# Patient Record
Sex: Female | Born: 1955 | Race: White | Hispanic: No | Smoking: Former smoker
Health system: Southern US, Community
[De-identification: ages and names within clinical notes are randomized; demographics above are authoritative.]

## PROBLEM LIST (undated history)

## (undated) DIAGNOSIS — B1920 Unspecified viral hepatitis C without hepatic coma: Secondary | ICD-10-CM

## (undated) DIAGNOSIS — J45909 Unspecified asthma, uncomplicated: Secondary | ICD-10-CM

## (undated) DIAGNOSIS — N289 Disorder of kidney and ureter, unspecified: Secondary | ICD-10-CM

## (undated) DIAGNOSIS — M069 Rheumatoid arthritis, unspecified: Secondary | ICD-10-CM

## (undated) DIAGNOSIS — A150 Tuberculosis of lung: Secondary | ICD-10-CM

## (undated) DIAGNOSIS — F411 Generalized anxiety disorder: Secondary | ICD-10-CM

## (undated) DIAGNOSIS — K259 Gastric ulcer, unspecified as acute or chronic, without hemorrhage or perforation: Secondary | ICD-10-CM

## (undated) DIAGNOSIS — G8929 Other chronic pain: Secondary | ICD-10-CM

## (undated) DIAGNOSIS — M62838 Other muscle spasm: Secondary | ICD-10-CM

## (undated) DIAGNOSIS — K7290 Hepatic failure, unspecified without coma: Secondary | ICD-10-CM

## (undated) DIAGNOSIS — G4700 Insomnia, unspecified: Secondary | ICD-10-CM

## (undated) DIAGNOSIS — E876 Hypokalemia: Secondary | ICD-10-CM

## (undated) HISTORY — DX: Rheumatoid arthritis, unspecified: M06.9

## (undated) HISTORY — DX: Generalized anxiety disorder: F41.1

## (undated) HISTORY — PX: CHOLECYSTECTOMY: SHX55

## (undated) HISTORY — DX: Other chronic pain: G89.29

## (undated) HISTORY — DX: Other muscle spasm: M62.838

## (undated) HISTORY — DX: Hypokalemia: E87.6

## (undated) HISTORY — DX: Insomnia, unspecified: G47.00

---

## 1982-05-17 DIAGNOSIS — A150 Tuberculosis of lung: Secondary | ICD-10-CM

## 1982-05-17 HISTORY — DX: Tuberculosis of lung: A15.0

## 2014-08-27 LAB — HM COLONOSCOPY

## 2015-03-03 ENCOUNTER — Encounter: Payer: Self-pay | Admitting: Radiology

## 2015-03-03 ENCOUNTER — Emergency Department: Payer: Medicare Other

## 2015-03-03 ENCOUNTER — Emergency Department
Admission: EM | Admit: 2015-03-03 | Discharge: 2015-03-03 | Disposition: A | Discharge status: Home / Self Care | Payer: Medicare Other | Attending: Emergency Medicine | Admitting: Emergency Medicine

## 2015-03-03 DIAGNOSIS — R1013 Epigastric pain: Secondary | ICD-10-CM | POA: Insufficient documentation

## 2015-03-03 DIAGNOSIS — R1011 Right upper quadrant pain: Secondary | ICD-10-CM

## 2015-03-03 DIAGNOSIS — Z91040 Latex allergy status: Secondary | ICD-10-CM | POA: Diagnosis not present

## 2015-03-03 HISTORY — DX: Unspecified asthma, uncomplicated: J45.909

## 2015-03-03 LAB — CBC WITH DIFFERENTIAL/PLATELET
BASOS ABS: 0 10*3/uL (ref 0–0.1)
BASOS PCT: 0 %
Eosinophils Absolute: 0.1 10*3/uL (ref 0–0.7)
Eosinophils Relative: 2 %
HEMATOCRIT: 45.3 % (ref 35.0–47.0)
HEMOGLOBIN: 15.8 g/dL (ref 12.0–16.0)
LYMPHS PCT: 29 %
Lymphs Abs: 2.2 10*3/uL (ref 1.0–3.6)
MCH: 33.5 pg (ref 26.0–34.0)
MCHC: 34.9 g/dL (ref 32.0–36.0)
MCV: 96.1 fL (ref 80.0–100.0)
MONOS PCT: 6 %
Monocytes Absolute: 0.5 10*3/uL (ref 0.2–0.9)
NEUTROS ABS: 4.7 10*3/uL (ref 1.4–6.5)
NEUTROS PCT: 63 %
Platelets: 160 10*3/uL (ref 150–440)
RBC: 4.72 MIL/uL (ref 3.80–5.20)
RDW: 12.7 % (ref 11.5–14.5)
WBC: 7.5 10*3/uL (ref 3.6–11.0)

## 2015-03-03 LAB — URINALYSIS COMPLETE WITH MICROSCOPIC (ARMC ONLY)
Bacteria, UA: NONE SEEN
Bilirubin Urine: NEGATIVE
Glucose, UA: NEGATIVE mg/dL
Hgb urine dipstick: NEGATIVE
KETONES UR: NEGATIVE mg/dL
NITRITE: NEGATIVE
Protein, ur: NEGATIVE mg/dL
SPECIFIC GRAVITY, URINE: 1.017 (ref 1.005–1.030)
pH: 5 (ref 5.0–8.0)

## 2015-03-03 LAB — COMPREHENSIVE METABOLIC PANEL
ALBUMIN: 4 g/dL (ref 3.5–5.0)
ALK PHOS: 58 U/L (ref 38–126)
ALT: 17 U/L (ref 14–54)
ANION GAP: 7 (ref 5–15)
AST: 22 U/L (ref 15–41)
BUN: 10 mg/dL (ref 6–20)
CALCIUM: 9.1 mg/dL (ref 8.9–10.3)
CO2: 25 mmol/L (ref 22–32)
Chloride: 106 mmol/L (ref 101–111)
Creatinine, Ser: 0.68 mg/dL (ref 0.44–1.00)
GFR calc non Af Amer: >60 mL/min (ref >60)
Glucose, Bld: 96 mg/dL (ref 65–99)
POTASSIUM: 3.6 mmol/L (ref 3.5–5.1)
SODIUM: 138 mmol/L (ref 135–145)
TOTAL PROTEIN: 7.7 g/dL (ref 6.5–8.1)
Total Bilirubin: 0.7 mg/dL (ref 0.3–1.2)

## 2015-03-03 LAB — LIPASE, BLOOD: Lipase: 27 U/L (ref 22–51)

## 2015-03-03 MED ORDER — KETOROLAC TROMETHAMINE 30 MG/ML IJ SOLN
30.0000 mg | Freq: Once | INTRAMUSCULAR | Status: AC
  Administered 2015-03-03: 30 mg via INTRAVENOUS
  Filled 2015-03-03: qty 1

## 2015-03-03 MED ORDER — RANITIDINE HCL 150 MG PO CAPS: 150.0000 mg | ORAL_CAPSULE | Freq: Two times a day (BID) | ORAL | Status: DC

## 2015-03-03 MED ORDER — OXYCODONE-ACETAMINOPHEN 5-325 MG PO TABS: 1.0000 | ORAL_TABLET | Freq: Four times a day (QID) | ORAL | Status: DC | PRN

## 2015-03-03 MED ORDER — SUCRALFATE 1 G PO TABS: 1.0000 g | ORAL_TABLET | Freq: Four times a day (QID) | ORAL | Status: AC

## 2015-03-03 MED ORDER — IOHEXOL 300 MG/ML  SOLN
75.0000 mL | Freq: Once | INTRAMUSCULAR | Status: AC | PRN
  Administered 2015-03-03: 75 mL via INTRAVENOUS
  Filled 2015-03-03: qty 75

## 2015-03-03 MED ORDER — IOHEXOL 240 MG/ML SOLN
25.0000 mL | INTRAMUSCULAR | Status: AC
  Administered 2015-03-03: 25 mL via ORAL
  Filled 2015-03-03: qty 25

## 2015-03-03 NOTE — ED Notes (Signed)
Patient transported to CT 

## 2015-03-03 NOTE — Discharge Instructions (Signed)
You were prescribed a medication that is potentially sedating. Do not drink alcohol, drive or participate in any other potentially dangerous activities while taking this medication as it may make you sleepy. Do not take this medication with any other sedating medications, either prescription or over-the-counter. If you were prescribed Percocet or Vicodin, do not take these with acetaminophen (Tylenol) as it is already contained within these medications. °  °Opioid pain medications (or "narcotics") can be habit forming.  Use it as little as possible to achieve adequate pain control.  Do not use or use it with extreme caution if you have a history of opiate abuse or dependence.  If you are on a pain contract with your primary care doctor or a pain specialist, be sure to let them know you were prescribed this medication today from the [facility].  This medication is intended for your use only - do not give any to anyone else and keep it in a secure place where nobody else, especially children and pets, have access to it.  It will also cause or worsen constipation, so you may want to consider taking an over-the-counter stool softener while you are taking this medication. ° °Abdominal Pain, Adult °Many things can cause abdominal pain. Usually, abdominal pain is not caused by a disease and will improve without treatment. It can often be observed and treated at home. Your health care provider will do a physical exam and possibly order blood tests and X-rays to help determine the seriousness of your pain. However, in many cases, more time must pass before a clear cause of the pain can be found. Before that point, your health care provider may not know if you need more testing or further treatment. °HOME CARE INSTRUCTIONS °Monitor your abdominal pain for any changes. The following actions may help to alleviate any discomfort you are experiencing: °· Only take over-the-counter or prescription  medicines as directed by your health care provider. °· Do not take laxatives unless directed to do so by your health care provider. °· Try a clear liquid diet (broth, tea, or water) as directed by your health care provider. Slowly move to a bland diet as tolerated. °SEEK MEDICAL CARE IF: °· You have unexplained abdominal pain. °· You have abdominal pain associated with nausea or diarrhea. °· You have pain when you urinate or have a bowel movement. °· You experience abdominal pain that wakes you in the night. °· You have abdominal pain that is worsened or improved by eating food. °· You have abdominal pain that is worsened with eating fatty foods. °· You have a fever. °SEEK IMMEDIATE MEDICAL CARE IF: °· Your pain does not go away within 2 hours. °· You keep throwing up (vomiting). °· Your pain is felt only in portions of the abdomen, such as the right side or the left lower portion of the abdomen. °· You pass bloody or black tarry stools. °MAKE SURE YOU: °· Understand these instructions. °· Will watch your condition. °· Will get help right away if you are not doing well or get worse. °  °This information is not intended to replace advice given to you by your health care provider. Make sure you discuss any questions you have with your health care provider. °  °Document Released: 02/10/2005 Document Revised: 01/22/2015 Document Reviewed: 01/10/2013 °Elsevier Interactive Patient Education ©2016 [employer] ° °

## 2015-03-03 NOTE — ED Provider Notes (Signed)
Sign out to follow up with a CAT scan of the abdomen and pelvis. Reassuring labs as well as vital signs. Physical Exam  BP 124/72 mmHg  Pulse 69  Temp(Src) 98 F (36.7 C) (Oral)  Resp 18  Ht 5\' 8"  (1.727 m)  Wt 109 lb (49.442 kg)  BMI 16.58 kg/m2  SpO2 100%  Physical Exam patient is resting comfortably on the gurney at the time of the exam. No outward signs of distress or pain.  ED Course  Procedures  MDM I discussed with the patient her lab results as well as her CAT scan results. She is aware of the renal cyst. She was given follow-up information for multiple local clinics. I also gave her copy of the CAT scan reports that she may take it with her to her follow-up appointment. She knows that she must follow-up for further evaluation and likely repeat imaging. We discussed there was no obvious cause of her lab work as well as imaging tonight to examine her pain. She understands the plan and is willing to comply. She'll be discharged home.     Myrna Blazeravid Matthew Sherylann Vangorden, MD 03/03/15 [id number]

## 2015-03-03 NOTE — ED Notes (Signed)
Pt in with co upper abd pain since 3 weeks, states has been told she had cirrhosis and fibrosis but patient unsure.
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
## 2015-03-13 ENCOUNTER — Encounter: Payer: Self-pay | Admitting: Emergency Medicine

## 2015-03-13 ENCOUNTER — Emergency Department
Admission: EM | Admit: 2015-03-13 | Discharge: 2015-03-13 | Discharge status: Against Medical Advice | Payer: Medicare Other | Attending: Emergency Medicine | Admitting: Emergency Medicine

## 2015-03-13 DIAGNOSIS — R079 Chest pain, unspecified: Secondary | ICD-10-CM | POA: Diagnosis not present

## 2015-03-13 DIAGNOSIS — Z87891 Personal history of nicotine dependence: Secondary | ICD-10-CM | POA: Insufficient documentation

## 2015-03-13 DIAGNOSIS — R1011 Right upper quadrant pain: Secondary | ICD-10-CM | POA: Insufficient documentation

## 2015-03-13 HISTORY — DX: Unspecified viral hepatitis C without hepatic coma: B19.20

## 2015-03-13 LAB — COMPREHENSIVE METABOLIC PANEL
ALK PHOS: 53 U/L (ref 38–126)
ALT: 9 U/L — AB (ref 14–54)
ANION GAP: 6 (ref 5–15)
AST: 18 U/L (ref 15–41)
Albumin: 4.1 g/dL (ref 3.5–5.0)
BILIRUBIN TOTAL: 0.8 mg/dL (ref 0.3–1.2)
BUN: 8 mg/dL (ref 6–20)
CALCIUM: 8.7 mg/dL — AB (ref 8.9–10.3)
CO2: 27 mmol/L (ref 22–32)
CREATININE: 0.63 mg/dL (ref 0.44–1.00)
Chloride: 104 mmol/L (ref 101–111)
Glucose, Bld: 89 mg/dL (ref 65–99)
Potassium: 3 mmol/L — ABNORMAL LOW (ref 3.5–5.1)
Sodium: 137 mmol/L (ref 135–145)
TOTAL PROTEIN: 7.9 g/dL (ref 6.5–8.1)

## 2015-03-13 LAB — CBC
HCT: 42.6 % (ref 35.0–47.0)
HEMOGLOBIN: 14.5 g/dL (ref 12.0–16.0)
MCH: 32.5 pg (ref 26.0–34.0)
MCHC: 34.1 g/dL (ref 32.0–36.0)
MCV: 95.5 fL (ref 80.0–100.0)
PLATELETS: 155 10*3/uL (ref 150–440)
RBC: 4.46 MIL/uL (ref 3.80–5.20)
RDW: 12.7 % (ref 11.5–14.5)
WBC: 5.9 10*3/uL (ref 3.6–11.0)

## 2015-03-13 LAB — LIPASE, BLOOD: Lipase: 27 U/L (ref 11–51)

## 2015-03-13 NOTE — ED Notes (Addendum)
Pt to ed with c/o right upper abd pain and chest pain, states history of HEP C.  Pt reports increased pain over the last few days.

## 2015-04-04 ENCOUNTER — Emergency Department: Payer: Medicare Other

## 2015-04-04 ENCOUNTER — Emergency Department
Admission: EM | Admit: 2015-04-04 | Discharge: 2015-04-04 | Disposition: A | Discharge status: Home / Self Care | Payer: Medicare Other | Attending: Emergency Medicine | Admitting: Emergency Medicine

## 2015-04-04 DIAGNOSIS — W1839XA Other fall on same level, initial encounter: Secondary | ICD-10-CM | POA: Diagnosis not present

## 2015-04-04 DIAGNOSIS — Y92012 Bathroom of single-family (private) house as the place of occurrence of the external cause: Secondary | ICD-10-CM | POA: Insufficient documentation

## 2015-04-04 DIAGNOSIS — Z79899 Other long term (current) drug therapy: Secondary | ICD-10-CM | POA: Diagnosis not present

## 2015-04-04 DIAGNOSIS — Y9389 Activity, other specified: Secondary | ICD-10-CM | POA: Insufficient documentation

## 2015-04-04 DIAGNOSIS — Z87891 Personal history of nicotine dependence: Secondary | ICD-10-CM | POA: Insufficient documentation

## 2015-04-04 DIAGNOSIS — Y998 Other external cause status: Secondary | ICD-10-CM | POA: Insufficient documentation

## 2015-04-04 DIAGNOSIS — Z91040 Latex allergy status: Secondary | ICD-10-CM | POA: Diagnosis not present

## 2015-04-04 DIAGNOSIS — S92351A Displaced fracture of fifth metatarsal bone, right foot, initial encounter for closed fracture: Secondary | ICD-10-CM

## 2015-04-04 DIAGNOSIS — R42 Dizziness and giddiness: Secondary | ICD-10-CM | POA: Diagnosis not present

## 2015-04-04 DIAGNOSIS — S99921A Unspecified injury of right foot, initial encounter: Secondary | ICD-10-CM | POA: Diagnosis present

## 2015-04-04 MED ORDER — OXYCODONE-ACETAMINOPHEN 5-325 MG PO TABS: 1.0000 | ORAL_TABLET | ORAL | Status: DC | PRN

## 2015-04-04 MED ORDER — OXYCODONE-ACETAMINOPHEN 5-325 MG PO TABS
2.0000 | ORAL_TABLET | Freq: Once | ORAL | Status: AC
  Administered 2015-04-04: 2 via ORAL
  Filled 2015-04-04: qty 2

## 2015-04-04 MED ORDER — DOCUSATE SODIUM 100 MG PO CAPS: ORAL_CAPSULE | ORAL | Status: DC
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
## 2015-04-04 NOTE — Discharge Instructions (Signed)
Please do not bear any weight with your affected leg.  Read through the instructions regarding routine injury care (rest, ice, compression, elevation).  Call in the morning to schedule a follow-up appointment on Monday with Dr. Hyacinth Meeker to discuss additional care and treatment of your fracture.  Return to the emergency department with new or worsening symptoms that concern you.   Metatarsal Fracture A metatarsal fracture is a broken bone in one of the five bones that connect your toes to the rest of your foot (forefoot fracture). Metatarsals are long bones that can be stressed or cracked easily. A metatarsal fracture can be:  A stress fracture. Stress fractures are cracks in the surface of the metatarsal bone. Athletes often get stress fractures.  A complete fracture. A complete fracture goes all the way through the bone. The bone that connects to the pinky toe (fifth metatarsal) is the most commonly fractured metatarsal. Ballet dancers often fracture this bone. CAUSES  Stress fractures may be caused by:  Poor training technique.  Sudden increase in activity.  Changing your activity to a harder surface.  Wearing athletic shoes that do not have enough cushioning. Complete fractures are usually caused by:  Dropping a heavy object on your foot.  An injury that severely twists your foot. SIGNS AND SYMPTOMS The most common symptom of a stress fracture is foot pain that goes away with rest. The most common symptom of a complete fracture is intense pain that persists after the injury. Other symptoms of both fracture types include:  Bruising.  Swelling.  Pain with movement or putting weight on the foot.  Tenderness or pain with pressure.  Trouble walking. DIAGNOSIS  Your health care provider may suspect a metatarsal fracture based on your symptoms and medical history. Your health care provider will also do a physical exam. During the physical exam, your health care provider may try to  move your foot and toes to check for pain and limited movement. Your foot will also be checked for:  Bruising.  Tenderness.  Swelling.  Deformity. Other tests that may be done include:  X-rays. X-rays are able to show most fractures.  A bone scan. This test may be necessary to show a stress fracture. TREATMENT  Treatment for a metatarsal fracture depends on how severe the fracture was and the type of fracture. Treatment may include:  Surgery. Surgery is usually needed to repair a displaced fracture. A displaced fracture happens when pieces of the broken bone are moved out of place (displacement). After surgery, you may need to wear a short walking cast for 6 to 8 weeks.  Medicines. Medicines may be used to reduce swelling and pain.  Physical therapy. Physical therapy may last for several months.  Use of a supportive device, such as:  Elastic wrap.  Splint.  Boot.  Cast.  Crutches to support weight on your foot until the broken bone heals. You can usually treat a stress fracture or a nondisplaced fracture with:   Rest.  Ice.  Elevation.  Support. HOME CARE INSTRUCTIONS  Follow all your health care provider's instructions.  Take medicine only as directed by your health care provider.  Rest your foot until your health care provider says you can resume your usual activities.  When resting, keep your foot raised above the level of your heart (elevated).  Ice may help reduce pain and swelling.  Place ice in a plastic bag.  Place a towel between your skin and the bag.  Leave the ice on  for 20 minutes, 2-3 times a day.  Wear your supportive device as directed.  Do not get your cast or splint wet.  Keep all follow-up visits as directed by your health care provider. If your health care provider recommended physical therapy, it is very important for proper healing of your injury that you keep all visits.   Cast or Splint Care Casts and splints support injured  limbs and keep bones from moving while they heal. It is important to care for your cast or splint at home.  HOME CARE INSTRUCTIONS  Keep the cast or splint uncovered during the drying period. It can take 24 to 48 hours to dry if it is made of plaster. A fiberglass cast will dry in less than 1 hour.  Do not rest the cast on anything harder than a pillow for the first 24 hours.  Do not put weight on your injured limb or apply pressure to the cast until your health care provider gives you permission.  Keep the cast or splint dry. Wet casts or splints can lose their shape and may not support the limb as well. A wet cast that has lost its shape can also create harmful pressure on your skin when it dries. Also, wet skin can become infected.  Cover the cast or splint with a plastic bag when bathing or when out in the rain or snow. If the cast is on the trunk of the body, take sponge baths until the cast is removed.  If your cast does become wet, dry it with a towel or a blow dryer on the cool setting only.  Keep your cast or splint clean. Soiled casts may be wiped with a moistened cloth.  Do not place any hard or soft foreign objects under your cast or splint, such as cotton, toilet paper, lotion, or powder.  Do not try to scratch the skin under the cast with any object. The object could get stuck inside the cast. Also, scratching could lead to an infection. If itching is a problem, use a blow dryer on a cool setting to relieve discomfort.  Do not trim or cut your cast or remove padding from inside of it.  Exercise all joints next to the injury that are not immobilized by the cast or splint. For example, if you have a long leg cast, exercise the hip joint and toes. If you have an arm cast or splint, exercise the shoulder, elbow, thumb, and fingers.  Elevate your injured arm or leg on 1 or 2 pillows for the first 1 to 3 days to decrease swelling and pain.It is best if you can comfortably elevate  your cast so it is higher than your heart. SEEK MEDICAL CARE IF:   Your cast or splint cracks.  Your cast or splint is too tight or too loose.  You have unbearable itching inside the cast.  Your cast becomes wet or develops a soft spot or area.  You have a bad smell coming from inside your cast.  You get an object stuck under your cast.  Your skin around the cast becomes red or raw.  You have new pain or worsening pain after the cast has been applied. SEEK IMMEDIATE MEDICAL CARE IF:   You have fluid leaking through the cast.  You are unable to move your fingers or toes.  You have discolored (blue or white), cool, painful, or very swollen fingers or toes beyond the cast.  You have tingling or numbness around  the injured area.  You have severe pain or pressure under the cast.  You have any difficulty with your breathing or have shortness of breath.  You have chest pain.   This information is not intended to replace advice given to you by your health care provider. Make sure you discuss any questions you have with your health care provider.   Document Released: 04/30/2000 Document Revised: 02/21/2013 Document Reviewed: 11/09/2012 Elsevier Interactive Patient Education [employer]

## 2015-04-04 NOTE — ED Notes (Signed)
Pt in radiology at this time. 

## 2015-04-04 NOTE — ED Provider Notes (Signed)
[facility] Emergency Department Provider Note  ____________________________________________  Time seen: Approximately 3:11 AM  I have reviewed the triage vital signs and the nursing notes.   HISTORY  Chief Complaint Foot Injury    HPI Victoria Bonilla is a 59 y.o. female who presents with right foot and ankle pain after a mechanical fall.  She reports that she felt a little bit dizzy when she stood up to go to the bathroom which is a result of some medication that she is taking and not unusual for her.  She rolled her ankle by accident and had immediate acute onset of severe pain in thelateral right ankle and in her aspect of her foot.  She feels like she heard or felt a snapping sensation when it occurred.  She did not sustain any other injuries including no head injury or loss of consciousness.  Her pain is worsened by any attempt at weightbearing and movement of the foot and ankle and relieved minimally with rest.   Past Medical History  Diagnosis Date  . Asthma   . Hepatitis C     There are no active problems to display for this patient.   No past surgical history on file.  Current Outpatient Rx  Name  Route  Sig  Dispense  Refill  . docusate sodium (COLACE) 100 MG capsule      Take 1 tablet once or twice daily as needed for constipation while taking narcotic pain medicine   30 capsule   0   . oxyCODONE-acetaminophen (ROXICET) 5-325 MG tablet   Oral   Take 1-2 tablets by mouth every 4 (four) hours as needed for severe pain.   20 tablet   0   . ranitidine (ZANTAC) 150 MG capsule   Oral   Take 1 capsule (150 mg total) by mouth 2 (two) times daily.   28 capsule   0   . sucralfate (CARAFATE) 1 G tablet   Oral   Take 1 tablet (1 g total) by mouth 4 (four) times daily.   120 tablet   1     Allergies Nsaids; Iodine; and Latex  No family history on file.  Social History Social History  Substance Use Topics  . Smoking status: Former  Games developermoker  . Smokeless tobacco: Not on file  . Alcohol Use: No    Review of Systems Constitutional: No fever/chills Cardiovascular: Denies chest pain. Respiratory: Denies shortness of breath. Gastrointestinal: No abdominal pain.  No nausea, no vomiting.  No diarrhea.  No constipation. Musculoskeletal: Severe pain in the right lateral ankle and right lateral foot with no other injuries reported Skin: Negative for rash. Neurological: Negative for headaches, focal weakness or numbness.  No syncope.  ____________________________________________   PHYSICAL EXAM:  VITAL SIGNS: ED Triage Vitals  Enc Vitals Group     BP 04/04/15 0205 123/55 mmHg     Pulse Rate 04/04/15 0205 69     Resp 04/04/15 0205 18     Temp 04/04/15 0205 97.4 F (36.3 C)     Temp Source 04/04/15 0205 Oral     SpO2 04/04/15 0205 99 %     Weight 04/04/15 0203 108 lb (48.988 kg)     Height 04/04/15 0203 5\' 8"  (1.727 m)     Head Cir --      Peak Flow --      Pain Score 04/04/15 0202 10     Pain Loc --      Pain Edu? --  Excl. in GC? --     Constitutional: Alert and oriented. Well appearing and in no acute distress. Head: Atraumatic. Neck: No stridor.  No cervical spine tenderness to palpation. Cardiovascular: Normal rate, regular rhythm. Grossly normal heart sounds.  Good peripheral circulation. Respiratory: Normal respiratory effort.  No retractions. Lungs CTAB. Musculoskeletal: No obvious deformity to the right foot but with mild swelling around the right ankle.  Severe tenderness to palpation both of the lateral ankle and the lateral foot in the region of the fifth metatarsal.  This is consistent with her radiograph findings.  Neurovascularly intact distal to the injury. Neurologic:  Normal speech and language. No gross focal neurologic deficits are appreciated.  Skin:  Skin is warm, dry and intact. No rash noted. Psychiatric: Mood and affect are normal. Speech and behavior are  normal.  ____________________________________________   LABS (all labs ordered are listed, but only abnormal results are displayed)  Labs Reviewed - No data to display ____________________________________________  EKG  Not indicated ____________________________________________  RADIOLOGY   Dg Ankle Complete Right  04/04/2015  CLINICAL DATA:  Rolled right ankle, with right ankle pain and swelling. Initial encounter. EXAM: RIGHT ANKLE - COMPLETE 3+ VIEW COMPARISON:  None. FINDINGS: There is no evidence of fracture or dislocation at the ankle. The ankle mortise is intact; the interosseous space is within normal limits. No talar tilt or subluxation is seen. A small plantar calcaneal spur is noted. The fracture through the fifth metatarsal is better characterized on concurrent foot radiographs. The joint spaces are preserved. An ankle joint effusion noted. Mild lateral soft tissue swelling is noted. IMPRESSION: No evidence of fracture or dislocation at the ankle. Ankle joint effusion noted. Fracture through the fifth metatarsal is better characterized on concurrent foot radiographs. Electronically Signed   By: Roanna Raider M.D.   On: 04/04/2015 02:52   Dg Foot Complete Right  04/04/2015  CLINICAL DATA:  Pain at the right fifth metatarsal and ankle after rolling ankle. Initial encounter. EXAM: RIGHT FOOT COMPLETE - 3+ VIEW COMPARISON:  None. FINDINGS: There is a mildly displaced oblique fracture through the fifth metatarsal, with mild medial and dorsal displacement. Overlying soft tissue swelling is noted. No additional fractures are seen. Visualized joint spaces are preserved. An os peroneum is noted. An os naviculare is seen. The ankle joint is grossly unremarkable in appearance. IMPRESSION: Mildly displaced oblique fracture through the fifth metatarsal, with mild medial and dorsal displacement. Electronically Signed   By: Roanna Raider M.D.   On: 04/04/2015 02:50     ____________________________________________   PROCEDURES  Procedure(s) performed: splint, see procedure note(s).   SPLINT APPLICATION Date/Time: 3:33 AM Authorized by: Loleta Rose Consent: Verbal consent obtained. Risks and benefits: risks, benefits and alternatives were discussed Consent given by: patient Splint applied by: ED technician Location details: right foot Splint type: posterior short leg Supplies used: ortho-glass Post-procedure: The splinted body part was neurovascularly unchanged following the procedure. Patient tolerance: Patient tolerated the procedure well with no immediate complications.  Critical Care performed: No ____________________________________________   INITIAL IMPRESSION / ASSESSMENT AND PLAN / ED COURSE  Pertinent labs & imaging results that were available during my care of the patient were reviewed by me and considered in my medical decision making (see chart for details).  I discussed the case by phone with Dr. Deeann Saint who agreed with my plan for posterior short leg splint, nonweightbearing, crutches, and he will see her early next week for a follow-up appointment.  I gave my  usual and customary return precautions and management recommendations.   ____________________________________________  FINAL CLINICAL IMPRESSION(S) / ED DIAGNOSES  Final diagnoses:  Closed fracture of fifth metatarsal bone, right, initial encounter      NEW MEDICATIONS STARTED DURING THIS VISIT:  New Prescriptions   DOCUSATE SODIUM (COLACE) 100 MG CAPSULE    Take 1 tablet once or twice daily as needed for constipation while taking narcotic pain medicine   OXYCODONE-ACETAMINOPHEN (ROXICET) 5-325 MG TABLET    Take 1-2 tablets by mouth every 4 (four) hours as needed for severe pain.     Loleta Rose, MD 04/04/15 [phone]

## 2015-04-04 NOTE — ED Notes (Signed)
Pt verbalizes understanding of discharge instructions.

## 2015-06-30 ENCOUNTER — Ambulatory Visit (INDEPENDENT_AMBULATORY_CARE_PROVIDER_SITE_OTHER): Payer: Medicare Other | Admitting: Primary Care

## 2015-06-30 ENCOUNTER — Encounter: Payer: Self-pay | Admitting: Primary Care

## 2015-06-30 VITALS — BP 132/72 | Ht 66.25 in | Wt 115.0 lb

## 2015-06-30 DIAGNOSIS — G8929 Other chronic pain: Secondary | ICD-10-CM

## 2015-06-30 DIAGNOSIS — M6249 Contracture of muscle, multiple sites: Secondary | ICD-10-CM

## 2015-06-30 DIAGNOSIS — E876 Hypokalemia: Secondary | ICD-10-CM

## 2015-06-30 DIAGNOSIS — J4520 Mild intermittent asthma, uncomplicated: Secondary | ICD-10-CM | POA: Diagnosis not present

## 2015-06-30 DIAGNOSIS — F411 Generalized anxiety disorder: Secondary | ICD-10-CM

## 2015-06-30 DIAGNOSIS — G4700 Insomnia, unspecified: Secondary | ICD-10-CM

## 2015-06-30 DIAGNOSIS — M62838 Other muscle spasm: Secondary | ICD-10-CM | POA: Insufficient documentation

## 2015-06-30 DIAGNOSIS — B1920 Unspecified viral hepatitis C without hepatic coma: Secondary | ICD-10-CM | POA: Insufficient documentation

## 2015-06-30 MED ORDER — CARISOPRODOL 350 MG PO TABS: 350.0000 mg | ORAL_TABLET | Freq: Four times a day (QID) | ORAL | Status: AC

## 2015-06-30 MED ORDER — ALBUTEROL SULFATE HFA 108 (90 BASE) MCG/ACT IN AERS: 2.0000 | INHALATION_SPRAY | Freq: Four times a day (QID) | RESPIRATORY_TRACT | Status: AC | PRN

## 2015-06-30 MED ORDER — POTASSIUM CHLORIDE CRYS ER 20 MEQ PO TBCR: 20.0000 meq | EXTENDED_RELEASE_TABLET | Freq: Every day | ORAL | Status: AC

## 2015-06-30 MED ORDER — BUSPIRONE HCL 7.5 MG PO TABS: 7.5000 mg | ORAL_TABLET | Freq: Two times a day (BID) | ORAL | Status: AC

## 2015-06-30 MED ORDER — TRAZODONE HCL 100 MG PO TABS: 100.0000 mg | ORAL_TABLET | Freq: Every day | ORAL | Status: AC

## 2015-06-30 NOTE — Assessment & Plan Note (Signed)
History of with recent cure. Following with Hep C clinic.

## 2015-06-30 NOTE — Assessment & Plan Note (Signed)
Recently trialed on trazodone 50 mg. She's been taking 2 tablets with improvement. RX for 100 mg tablets sent to pharmacy. Will continue to monitor.

## 2015-06-30 NOTE — Patient Instructions (Addendum)
Start Buspar 7.5 mg tablets for daily anxiety. Take 1 tablet by mouth twice daily.  I've sent in refills of your potassium, albuterol inhaler, carisoprodol, and trazodone. I sent in Trazodone 100 mg tablets. Take 1 tablet by mouth at bedtime for sleep.  Follow up in 6 weeks for re-evaluation of anxiety.  It was a pleasure to meet you today! Please don't hesitate to call me with any questions. Welcome to [facility]!

## 2015-06-30 NOTE — Progress Notes (Signed)
Pre visit review using our clinic review tool, if applicable. No additional management support is needed unless otherwise documented below in the visit note. 

## 2015-06-30 NOTE — Progress Notes (Signed)
Subjective:    Patient ID: Victoria Bonilla, female    DOB: 02-12-1956, 60 y.o.   MRN: [id number]  HPI  Victoria Bonilla is a 60 year old female who presents today to establish care and discuss the problems mentioned below. Will obtain old records. Her last physical was November 2016. She is requesting a refill of her medications and has been out for several days.  1) Insomnia: Currently managed on Trazodone 50 mg, but is taking 100 mg. She was recently placed on this medication. She is not currently taking Temazepam.    2) Asthma: Diagnosed years ago. She has an albuterol inhaler that she will use once weekly as needed, worse during spring and summer months. No recent exacerbations.  3) Rheumatoid Arthritis: Diagnosed years ago ago. She endorses treatment in prior years and has trialed most RA medications. She didn't respond well to RA medications and does not currently follow with a rheumatologist. She is not interested in following with one now.   4) Metatarsal Fracture: Located to right foot. Injured 4 months. She is currently following with Dr. Hyacinth Meeker at [facility]. Her next follow up is February 28th.   5) Muscle Spasms: Chronic. She is currently managed on carisoprodol 350 mg four times daily and has been on this medication years. History of Restless Leg Syndrome as well.  6) Generalized Anxiety Disorder: Diagnosed years ago. She is currently managed on Valium 10 mg once to twice daily. GAD 7 score of 13 today. Denies SI/HI. She is reticent to trial daily SSRI, but is open.   7) Gastrointestinal Ulcers: She is currently managed on surcralfate 1 gram. She will take once daily, sometimes will skip. Overall improvement in symptoms with carafate.   8) Hypokalemia: Diagnosed years ago. She is currently managed on Potassium 20 meQ once daily. Denies chest pain. Last potassium level was in November per patient and was normal.  Review of Systems  Constitutional: Negative for unexpected  weight change.  Respiratory: Negative for cough, shortness of breath and wheezing.   Cardiovascular: Negative for chest pain.  Gastrointestinal: Negative for abdominal pain.  Genitourinary: Negative for difficulty urinating.  Musculoskeletal:       Chronic shoulder, back, foot pain.  Skin: Negative for rash.  Allergic/Immunologic: Positive for environmental allergies.  Neurological: Negative for dizziness, numbness and headaches.  Psychiatric/Behavioral: Positive for sleep disturbance. The patient is nervous/anxious.        See HPI. Denies SI/HI, concerns for depression       Past Medical History  Diagnosis Date  . Asthma   . Hepatitis C   . Chronic pain   . Hypokalemia   . Muscle spasm   . Generalized anxiety disorder   . Rheumatoid arthritis (HCC)   . Insomnia     Social History   Social History  . Marital Status: Widowed    Spouse Name: N/A  . Number of Children: N/A  . Years of Education: N/A   Occupational History  . Not on file.   Social History Main Topics  . Smoking status: Former Games developer  . Smokeless tobacco: Not on file  . Alcohol Use: No  . Drug Use: No  . Sexual Activity: Not on file   Other Topics Concern  . Not on file   Social History Narrative   Single.   Disabled.    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   Enjoys reading, writing.     No past surgical history on file.  No family history on file.  Allergies  Allergen Reactions  . Nsaids Shortness Of Breath  . Tolmetin Shortness Of Breath  . Tramadol Swelling  . Iodine Other (See Comments)    Burns skin   . Latex Swelling  . Other     Peg-intron (per pt)  . Iodides Rash    Pt states to iodine on skin caused rash; states that she has had IV/po contrast abd CTs multiple times in past without problem Pt states to iodine on skin caused rash; states that she has had IV/po contrast abd CTs multiple times in past without problem    Current Outpatient  Prescriptions on File Prior to Visit  Medication Sig Dispense Refill  . sucralfate (CARAFATE) 1 G tablet Take 1 tablet (1 g total) by mouth 4 (four) times daily. 120 tablet 1   No current facility-administered medications on file prior to visit.    BP 132/72 mmHg  Ht 5' 6.25" (1.683 m)  Wt 115 lb (52.164 kg)  BMI 18.42 kg/m2    Objective:   Physical Exam  Constitutional: She is oriented to person, place, and time. She appears well-nourished.  Neck: Neck supple.  Cardiovascular: Normal rate and regular rhythm.   Pulmonary/Chest: Effort normal and breath sounds normal.  Musculoskeletal:  Boot to right foot. Decrease in ROM to right shoulder.  Neurological: She is alert and oriented to person, place, and time.  Skin: Skin is warm and dry.  Psychiatric: She has a normal mood and affect.          Assessment & Plan:

## 2015-06-30 NOTE — Assessment & Plan Note (Signed)
Diagnosed in youth. Currently managed on valium 2-3 times daily by prior PCP. Discussed the importance of daily management with long term medication. She is reticent about SSRI's as she's read "bad things". Will start buspar BID and have her follow up in 6 weeks for re-evaluation. GAD 7 score of 13 today.

## 2015-06-30 NOTE — Assessment & Plan Note (Signed)
Also present during daytime hours, worse at night with RLS. Managed on Soma QID for years. Refill provided.

## 2015-06-30 NOTE — Assessment & Plan Note (Addendum)
Long standing history of. Managed on potassium 20 meq. Refill provided today. Last potassium level normal per patient. Will recheck at next visit as her level will likely be lower today due to running out of meds 3 days ago.

## 2015-06-30 NOTE — Assessment & Plan Note (Signed)
Present to shoulder, back, foot. Currently taking tylenol #4. Discussed that she will need to manage with pain management for further refills.  She has an unfilled script with her today that she plans on filling. Referral to pain management placed today.

## 2015-06-30 NOTE — Assessment & Plan Note (Signed)
More prevalent during spring and summer months. Has albuterol inhaler, uses sparingly.

## 2015-07-11 ENCOUNTER — Telehealth: Payer: Self-pay | Admitting: Primary Care

## 2015-07-11 ENCOUNTER — Encounter: Payer: Self-pay | Admitting: Primary Care

## 2015-07-19 ENCOUNTER — Emergency Department: Payer: Medicare Other

## 2015-07-19 ENCOUNTER — Encounter: Payer: Self-pay | Admitting: Emergency Medicine

## 2015-07-19 ENCOUNTER — Emergency Department
Admission: EM | Admit: 2015-07-19 | Discharge: 2015-07-19 | Disposition: A | Discharge status: Home / Self Care | Payer: Medicare Other | Attending: Emergency Medicine | Admitting: Emergency Medicine

## 2015-07-19 DIAGNOSIS — Z79899 Other long term (current) drug therapy: Secondary | ICD-10-CM | POA: Insufficient documentation

## 2015-07-19 DIAGNOSIS — Y658 Other specified misadventures during surgical and medical care: Secondary | ICD-10-CM | POA: Diagnosis not present

## 2015-07-19 DIAGNOSIS — K122 Cellulitis and abscess of mouth: Secondary | ICD-10-CM | POA: Diagnosis not present

## 2015-07-19 DIAGNOSIS — T85848A Pain due to other internal prosthetic devices, implants and grafts, initial encounter: Secondary | ICD-10-CM

## 2015-07-19 DIAGNOSIS — Z87891 Personal history of nicotine dependence: Secondary | ICD-10-CM | POA: Diagnosis not present

## 2015-07-19 DIAGNOSIS — Z91040 Latex allergy status: Secondary | ICD-10-CM | POA: Insufficient documentation

## 2015-07-19 DIAGNOSIS — T82848A Pain from vascular prosthetic devices, implants and grafts, initial encounter: Secondary | ICD-10-CM | POA: Diagnosis not present

## 2015-07-19 DIAGNOSIS — K0889 Other specified disorders of teeth and supporting structures: Secondary | ICD-10-CM | POA: Diagnosis present

## 2015-07-19 DIAGNOSIS — R079 Chest pain, unspecified: Secondary | ICD-10-CM

## 2015-07-19 LAB — CBC WITH DIFFERENTIAL/PLATELET
BASOS ABS: 0.1 10*3/uL (ref 0–0.1)
BASOS PCT: 1 %
EOS ABS: 0.1 10*3/uL (ref 0–0.7)
Eosinophils Relative: 3 %
HEMATOCRIT: 39.9 % (ref 35.0–47.0)
Hemoglobin: 14 g/dL (ref 12.0–16.0)
Lymphocytes Relative: 28 %
Lymphs Abs: 1.4 10*3/uL (ref 1.0–3.6)
MCH: 33.1 pg (ref 26.0–34.0)
MCHC: 35 g/dL (ref 32.0–36.0)
MCV: 94.5 fL (ref 80.0–100.0)
MONO ABS: 0.3 10*3/uL (ref 0.2–0.9)
MONOS PCT: 6 %
NEUTROS ABS: 3.2 10*3/uL (ref 1.4–6.5)
Neutrophils Relative %: 62 %
PLATELETS: 133 10*3/uL — AB (ref 150–440)
RBC: 4.23 MIL/uL (ref 3.80–5.20)
RDW: 13 % (ref 11.5–14.5)
WBC: 5.1 10*3/uL (ref 3.6–11.0)

## 2015-07-19 LAB — BASIC METABOLIC PANEL
ANION GAP: 5 (ref 5–15)
BUN: 9 mg/dL (ref 6–20)
CALCIUM: 8.5 mg/dL — AB (ref 8.9–10.3)
CO2: 27 mmol/L (ref 22–32)
CREATININE: 0.47 mg/dL (ref 0.44–1.00)
Chloride: 107 mmol/L (ref 101–111)
GLUCOSE: 85 mg/dL (ref 65–99)
Potassium: 3.3 mmol/L — ABNORMAL LOW (ref 3.5–5.1)
Sodium: 139 mmol/L (ref 135–145)

## 2015-07-19 LAB — TROPONIN I

## 2015-07-19 MED ORDER — HYDROCODONE-ACETAMINOPHEN 5-325 MG PO TABS
1.0000 | ORAL_TABLET | Freq: Once | ORAL | Status: AC
  Administered 2015-07-19: 1 via ORAL

## 2015-07-19 MED ORDER — HYDROCODONE-ACETAMINOPHEN 5-325 MG PO TABS
ORAL_TABLET | ORAL | Status: AC
  Administered 2015-07-19: 15:00:00
  Filled 2015-07-19: qty 1

## 2015-07-19 MED ORDER — PREDNISONE 20 MG PO TABS: ORAL_TABLET | ORAL | Status: AC

## 2015-07-19 MED ORDER — HYDROCODONE-ACETAMINOPHEN 5-325 MG PO TABS: 1.0000 | ORAL_TABLET | Freq: Four times a day (QID) | ORAL | Status: DC | PRN

## 2015-07-19 MED ORDER — MAGIC MOUTHWASH W/LIDOCAINE: 5.0000 mL | Freq: Four times a day (QID) | ORAL | Status: AC | PRN

## 2015-07-19 MED ORDER — AMOXICILLIN-POT CLAVULANATE 875-125 MG PO TABS
1.0000 | ORAL_TABLET | Freq: Two times a day (BID) | ORAL | Status: AC
Start: 2015-07-19 — End: ?

## 2015-07-19 NOTE — ED Notes (Signed)
Pt reports having 4 previous dental implants fail. Also reports x1 episode of chest pain last night relieved by aspirin. Pt thinks it was caused by swallowing too much pus.

## 2015-07-19 NOTE — Discharge Instructions (Signed)
Abscess An abscess (boil or furuncle) is an infected area on or under the skin. This area is filled with yellowish-white fluid (pus) and other material (debris). HOME CARE   Only take medicines as told by your doctor.  If you were given antibiotic medicine, take it as directed. Finish the medicine even if you start to feel better.  If gauze is used, follow your doctor's directions for changing the gauze.  To avoid spreading the infection:  Keep your abscess covered with a bandage.  Wash your hands well.  Do not share personal care items, towels, or whirlpools with others.  Avoid skin contact with others.  Keep your skin and clothes clean around the abscess.  Keep all doctor visits as told. GET HELP RIGHT AWAY IF:   You have more pain, puffiness (swelling), or redness in the wound site.  You have more fluid or blood coming from the wound site.  You have muscle aches, chills, or you feel sick.  You have a fever. MAKE SURE YOU:   Understand these instructions.  Will watch your condition.  Will get help right away if you are not doing well or get worse.   This information is not intended to replace advice given to you by your health care provider. Make sure you discuss any questions you have with your health care provider.   Document Released: 10/20/2007 Document Revised: 11/02/2011 Document Reviewed: 07/17/2011 Elsevier Interactive Patient Education 2016 Elsevier Inc.  Chest Pain Observation It is often hard to give a specific diagnosis for the cause of chest pain. Among other possibilities your symptoms might be caused by inadequate oxygen delivery to your heart (angina). Angina that is not treated or evaluated can lead to a heart attack (myocardial infarction) or death. Blood tests, electrocardiograms, and X-rays may have been done to help determine a possible cause of your chest pain. After evaluation and observation, your health care provider has determined that it is  unlikely your pain was caused by an unstable condition that requires hospitalization. However, a full evaluation of your pain may need to be completed, with additional diagnostic testing as directed. It is very important to keep your follow-up appointments. Not keeping your follow-up appointments could result in permanent heart damage, disability, or death. If there is any problem keeping your follow-up appointments, you must call your health care provider. HOME CARE INSTRUCTIONS  Due to the slight chance that your pain could be angina, it is important to follow your health care provider's treatment plan and also maintain a healthy lifestyle:  Maintain or work toward achieving a healthy weight.  Stay physically active and exercise regularly.  Decrease your salt intake.  Eat a balanced, healthy diet. Talk to a dietitian to learn about heart-healthy foods.  Increase your fiber intake by including whole grains, vegetables, fruits, and nuts in your diet.  Avoid situations that cause stress, anger, or depression.  Take medicines as advised by your health care provider. Report any side effects to your health care provider. Do not stop medicines or adjust the dosages on your own.  Quit smoking. Do not use nicotine patches or gum until you check with your health care provider.  Keep your blood pressure, blood sugar, and cholesterol levels within normal limits.  Limit alcohol intake to no more than 1 drink per day for women who are not pregnant and 2 drinks per day for men.  Do not abuse drugs. SEEK IMMEDIATE MEDICAL CARE IF: You have severe chest pain or pressure  which may include symptoms such as:  You feel pain or pressure in your arms, neck, jaw, or back.  You have severe back or abdominal pain, feel sick to your stomach (nauseous), or throw up (vomit).  You are sweating profusely.  You are having a fast or irregular heartbeat.  You feel short of breath while at rest.  You notice  increasing shortness of breath during rest, sleep, or with activity.  You have chest pain that does not get better after rest or after taking your usual medicine.  You wake from sleep with chest pain.  You are unable to sleep because you cannot breathe.  You develop a frequent cough or you are coughing up blood.  You feel dizzy, faint, or experience extreme fatigue.  You develop severe weakness, dizziness, fainting, or chills. Any of these symptoms may represent a serious problem that is an emergency. Do not wait to see if the symptoms will go away. Call your local emergency services (911 in the [place] Do not drive yourself to the hospital. MAKE SURE YOU:  Understand these instructions.  Will watch your condition.  Will get help right away if you are not doing well or get worse.   This information is not intended to replace advice given to you by your health care provider. Make sure you discuss any questions you have with your health care provider.   Document Released: 06/05/2010 Document Revised: 05/08/2013 Document Reviewed: 11/02/2012 Elsevier Interactive Patient Education [employer]2016 Elsevier Inc.

## 2015-07-19 NOTE — ED Notes (Signed)
Pt not discharged at this time due to 10/10 pain.  Awaiting provider order for intervention.

## 2015-07-19 NOTE — ED Notes (Signed)
Painful dental implant lower x 2 days. States has had implant x 15 years.

## 2015-07-19 NOTE — ED Provider Notes (Signed)
EKG interpreted by me Sinus bradycardia rate of 53, normal axis and intervals. Poor R wave progression in anterior precordial leads. Normal ST segments and T waves  Sharman CheekPhillip Dimple Bastyr, MD 07/19/15 1201

## 2015-07-19 NOTE — ED Notes (Signed)
Pt refused icepack.

## 2015-07-19 NOTE — ED Provider Notes (Signed)
[facility] Emergency Department Provider Note  ____________________________________________  Time seen: Approximately 11:44 AM  I have reviewed the triage vital signs and the nursing notes.   HISTORY  Chief Complaint Dental Pain    HPI Victoria Bonilla is a 60 y.o. female , NAD, presents to the emergency department with 2-3 day history of lower jaw pain. States she's had a dental implant for 15 years and has noted pain increasing over the last 3 days. Notes possible the area in which she has been swallowing. Had chills as well as substernal sharp chest pain last night. Pain lasted for a few minutes. Took low-dose aspirin which she states alleviated the pain. Denies any shortness of breath, numbness, weakness, tingling. States chest pain began after swallowing a lot of purulent fluid from the implant site. Notes that when she originally got dental implants were placed and 3 were rejected. This is the only one that remained. Denies fevers but did have chills last night.   Past Medical History  Diagnosis Date  . Asthma   . Hepatitis C   . Chronic pain   . Hypokalemia   . Muscle spasm   . Generalized anxiety disorder   . Rheumatoid arthritis (HCC)   . Insomnia     Patient Active Problem List   Diagnosis Date Noted  . Insomnia 06/30/2015  . Night muscle spasms 06/30/2015  . Hypokalemia 06/30/2015  . Asthma, mild intermittent 06/30/2015  . Generalized anxiety disorder 06/30/2015  . Chronic pain 06/30/2015  . Hepatitis C 06/30/2015    History reviewed. No pertinent past surgical history.  Current Outpatient Rx  Name  Route  Sig  Dispense  Refill  . albuterol (PROVENTIL HFA;VENTOLIN HFA) 108 (90 Base) MCG/ACT inhaler   Inhalation   Inhale 2 puffs into the lungs every 6 (six) hours as needed.   1 Inhaler   5   . amoxicillin-clavulanate (AUGMENTIN) 875-125 MG tablet   Oral   Take 1 tablet by mouth 2 (two) times daily.   20 tablet   0   . busPIRone  (BUSPAR) 7.5 MG tablet   Oral   Take 1 tablet (7.5 mg total) by mouth 2 (two) times daily.   60 tablet   3   . carisoprodol (SOMA) 350 MG tablet   Oral   Take 1 tablet (350 mg total) by mouth 4 (four) times daily.   120 tablet   1   . diazepam (VALIUM) 10 MG tablet   Oral   Take 10 mg by mouth every 8 (eight) hours as needed.          Marland Kitchen HYDROcodone-acetaminophen (NORCO) 5-325 MG tablet   Oral   Take 1 tablet by mouth every 6 (six) hours as needed for severe pain.   10 tablet   0   . magic mouthwash w/lidocaine SOLN   Oral   Take 5 mLs by mouth 4 (four) times daily as needed for mouth pain.   240 mL   0     Please mix 80mL diphenhydramine, 80mL nystatin, 80 ...   . potassium chloride SA (K-DUR,KLOR-CON) 20 MEQ tablet   Oral   Take 1 tablet (20 mEq total) by mouth daily.   90 tablet   2   . predniSONE (DELTASONE) 20 MG tablet      Take 2 tablets by mouth, once daily, for 5 days   10 tablet   0   . sucralfate (CARAFATE) 1 G tablet   Oral  Take 1 tablet (1 g total) by mouth 4 (four) times daily.   120 tablet   1   . traZODone (DESYREL) 100 MG tablet   Oral   Take 1 tablet (100 mg total) by mouth at bedtime.   90 tablet   1     Allergies Nsaids; Tolmetin; Tramadol; Iodine; Latex; Other; and Iodides  No family history on file.  Social History Social History  Substance Use Topics  . Smoking status: Former Games developer  . Smokeless tobacco: None  . Alcohol Use: No     Review of Systems  Constitutional: Positive chills. No fever, fatigue. Eyes: No visual changes.  ENT: Lower anterior jaw pain at the implant site. Notes swelling in the same area. No sore throat, sinus pressure. Cardiovascular: Positive sharp substernal chest pain. Respiratory: No cough. No shortness of breath. No wheezing.  Gastrointestinal: No abdominal pain.  No nausea, vomiting.   Musculoskeletal: Negative for back and neck pain.  Skin: Negative for rash. Neurological: Negative for  headaches, focal weakness or numbness. 10-point ROS otherwise negative.  ____________________________________________   PHYSICAL EXAM:  VITAL SIGNS: ED Triage Vitals  Enc Vitals Group     BP 07/19/15 1107 116/58 mmHg     Pulse Rate 07/19/15 1107 66     Resp 07/19/15 1107 18     Temp 07/19/15 1107 97.8 F (36.6 C)     Temp Source 07/19/15 1107 Oral     SpO2 07/19/15 1107 99 %     Weight 07/19/15 1107 102 lb (46.267 kg)     Height 07/19/15 1107  (1.702 m)     Head Cir --      Peak Flow --      Pain Score 07/19/15 1107 9     Pain Loc --      Pain Edu? --      Excl. in GC? --     Constitutional: Alert and oriented. Well appearing and in no acute distress but in pain. Eyes: Conjunctivae are normal. PERRL. EOMI without pain.  Head: Atraumatic. ENT:      Ears: TMs visualized bilaterally without effusion, erythema, bulging.      Nose: No congestion/rhinnorhea.      Mouth/Throat: Lower anterior gum line with mild erythema and swelling about dental implant site. No disruption in soft tissue. Area is tender to palpation. No active oozing or weeping at this time. No tenderness along the mandible. Mucous membranes are moist.  Neck: No stridor. Supple with full range of motion Hematological/Lymphatic/Immunilogical: No cervical lymphadenopathy. Cardiovascular: Normal rate, regular rhythm. Normal S1 and S2.  Good peripheral circulation. Respiratory: Normal respiratory effort without tachypnea or retractions. Lungs CTAB. Neurologic:  Normal speech and language. No gross focal neurologic deficits are appreciated.  Skin:  Skin is warm, dry and intact. No rash noted. Psychiatric: Mood and affect are normal. Speech and behavior are normal. Patient exhibits appropriate insight and judgement.   ____________________________________________   LABS (all labs ordered are listed, but only abnormal results are displayed)  Labs Reviewed  BASIC METABOLIC PANEL - Abnormal; Notable for the  following:    Potassium 3.3 (*)    Calcium 8.5 (*)    All other components within normal limits  CBC WITH DIFFERENTIAL/PLATELET - Abnormal; Notable for the following:    Platelets 133 (*)    All other components within normal limits  TROPONIN I   ____________________________________________  EKG  EKG with sinus bradycardia with a rate of 53 bpm. No acute changes  no evidence of STEMI. EKG also reviewed by Dr. Alfonse FlavorsPhilip Stafford. ____________________________________________  RADIOLOGY I have personally viewed and evaluated these images (plain radiographs) as part of my medical decision making, as well as reviewing the written report by the radiologist.  Dg Mandible 4 Views  07/19/2015  CLINICAL DATA:  anterior mandible pain in dental implant area (implant done 15+yrs ago EXAM: MANDIBLE - 4+ VIEW COMPARISON:  None. FINDINGS: Mandibular dental implant to the left of midline noted. No surrounding acute osseous abnormality appreciated. The radio-opaque portion of the implant appears to be intact. IMPRESSION: No acute findings Electronically Signed   By: Esperanza Heiraymond  Rubner M.D.   On: 07/19/2015 12:23   Dg Chest 2 View  07/19/2015  CLINICAL DATA:  Former smoker.  Drainage. EXAM: CHEST  2 VIEW COMPARISON:  None. FINDINGS: No pneumothorax. The heart, hila, and mediastinum are normal. No pulmonary nodules or masses are seen. No focal infiltrates. No overt edema. IMPRESSION: No active cardiopulmonary disease. Electronically Signed   By: Gerome Samavid  Williams III M.D   On: 07/19/2015 12:21    ____________________________________________    PROCEDURES  Procedure(s) performed: None    Medications - No data to display   ____________________________________________   INITIAL IMPRESSION / ASSESSMENT AND PLAN / ED COURSE  Pertinent labs & imaging results that were available during my care of the patient were reviewed by me and considered in my medical decision making (see chart for details). Labs stable  with previous lab results of previous visits. No significant abnormalities at this time. Patient had no further chest pain during her ED course and was stable prior to discharge.  Patient's diagnosis is consistent with cellulitis and abscess of oral soft tissue due to dental implant and nonspecific chest pain. Patient will be discharged home with prescriptions for Augmentin 875 mg to take one tablet by mouth twice daily for 10 days. Patient also given Magic mouthwash to rinse 4 times daily as needed. Prescribed prednisone and Norco to decrease inflammation and pain. Advise patient follow up with primary care provider on Monday for recheck. Patient is to [facility] for recheck and evaluation of her dental implant. Patient is given ED precautions to return to the ED for any worsening or new symptoms.      ____________________________________________  FINAL CLINICAL IMPRESSION(S) / ED DIAGNOSES  Final diagnoses:  Abscess or cellulitis, oral soft tissue  Dental implant pain, initial encounter  Chest pain, unspecified      NEW MEDICATIONS STARTED DURING THIS VISIT:  New Prescriptions   AMOXICILLIN-CLAVULANATE (AUGMENTIN) 875-125 MG TABLET    Take 1 tablet by mouth 2 (two) times daily.   HYDROCODONE-ACETAMINOPHEN (NORCO) 5-325 MG TABLET    Take 1 tablet by mouth every 6 (six) hours as needed for severe pain.   MAGIC MOUTHWASH W/LIDOCAINE SOLN    Take 5 mLs by mouth 4 (four) times daily as needed for mouth pain.   PREDNISONE (DELTASONE) 20 MG TABLET    Take 2 tablets by mouth, once daily, for 5 days         Hope PigeonJami L Corderius Saraceni, PA-C 07/19/15 1339   ----------------------------------------- 2:16 PM on 07/19/2015 -----------------------------------------  At the time of discharge patient states her pain is increased to 10 out of 10 and is requesting pain medication. Will give one Norco tablet and recheck patient in about 20 minutes.  Hope PigeonJami L Carsen Machi,  PA-C 07/19/15 1416  Sharman CheekPhillip Stafford, MD 07/19/15 1555

## 2015-07-24 ENCOUNTER — Encounter: Payer: Self-pay | Admitting: Primary Care

## 2015-08-12 ENCOUNTER — Ambulatory Visit: Payer: Self-pay | Admitting: Family Medicine

## 2015-08-13 ENCOUNTER — Encounter: Payer: Self-pay | Admitting: Family Medicine

## 2016-05-02 ENCOUNTER — Emergency Department: Payer: Medicare Other

## 2016-05-02 ENCOUNTER — Emergency Department
Admission: EM | Admit: 2016-05-02 | Discharge: 2016-05-02 | Disposition: A | Discharge status: Home / Self Care | Payer: Medicare Other | Attending: Emergency Medicine | Admitting: Emergency Medicine

## 2016-05-02 DIAGNOSIS — J45909 Unspecified asthma, uncomplicated: Secondary | ICD-10-CM | POA: Diagnosis not present

## 2016-05-02 DIAGNOSIS — W06XXXA Fall from bed, initial encounter: Secondary | ICD-10-CM | POA: Diagnosis not present

## 2016-05-02 DIAGNOSIS — Z91040 Latex allergy status: Secondary | ICD-10-CM | POA: Insufficient documentation

## 2016-05-02 DIAGNOSIS — Y929 Unspecified place or not applicable: Secondary | ICD-10-CM | POA: Insufficient documentation

## 2016-05-02 DIAGNOSIS — Y999 Unspecified external cause status: Secondary | ICD-10-CM | POA: Diagnosis not present

## 2016-05-02 DIAGNOSIS — Z79899 Other long term (current) drug therapy: Secondary | ICD-10-CM | POA: Diagnosis not present

## 2016-05-02 DIAGNOSIS — S0990XA Unspecified injury of head, initial encounter: Secondary | ICD-10-CM | POA: Diagnosis present

## 2016-05-02 DIAGNOSIS — Z87891 Personal history of nicotine dependence: Secondary | ICD-10-CM | POA: Insufficient documentation

## 2016-05-02 DIAGNOSIS — Y939 Activity, unspecified: Secondary | ICD-10-CM | POA: Diagnosis not present

## 2016-05-02 DIAGNOSIS — S060X0A Concussion without loss of consciousness, initial encounter: Secondary | ICD-10-CM

## 2016-05-02 HISTORY — DX: Disorder of kidney and ureter, unspecified: N28.9

## 2016-05-02 HISTORY — DX: Tuberculosis of lung: A15.0

## 2016-05-02 HISTORY — DX: Gastric ulcer, unspecified as acute or chronic, without hemorrhage or perforation: K25.9

## 2016-05-02 LAB — BASIC METABOLIC PANEL
Anion gap: 6 (ref 5–15)
BUN: 9 mg/dL (ref 6–20)
CHLORIDE: 102 mmol/L (ref 101–111)
CO2: 30 mmol/L (ref 22–32)
Calcium: 9.1 mg/dL (ref 8.9–10.3)
Creatinine, Ser: 0.59 mg/dL (ref 0.44–1.00)
GFR calc Af Amer: >60 mL/min (ref >60)
GFR calc non Af Amer: >60 mL/min (ref >60)
GLUCOSE: 108 mg/dL — AB (ref 65–99)
POTASSIUM: 3.8 mmol/L (ref 3.5–5.1)
Sodium: 138 mmol/L (ref 135–145)

## 2016-05-02 LAB — ETHANOL: Alcohol, Ethyl (B): <5 mg/dL (ref <5)

## 2016-05-02 LAB — CBC
HEMATOCRIT: 41.4 % (ref 35.0–47.0)
HEMOGLOBIN: 14.7 g/dL (ref 12.0–16.0)
MCH: 33.2 pg (ref 26.0–34.0)
MCHC: 35.6 g/dL (ref 32.0–36.0)
MCV: 93.4 fL (ref 80.0–100.0)
Platelets: 156 10*3/uL (ref 150–440)
RBC: 4.43 MIL/uL (ref 3.80–5.20)
RDW: 12.6 % (ref 11.5–14.5)
WBC: 5.5 10*3/uL (ref 3.6–11.0)

## 2016-05-02 NOTE — ED Triage Notes (Signed)
Pt reports "something pulled me out of bed and I don't remember much after that".  Pt reports that this has happened before where some unknown thing pulled her out of bed.  Pt denies any history of hallucinations and states that she was fully awake at the time of the fall.  Pt states no one else was in the room with her.  Pt states she lives with other people, but they did not see her fall.  Pt states she blacked out for "seconds".  Pt slurring words some in triage, ambulatory to triage with NAD noted otherwise.

## 2016-05-02 NOTE — ED Notes (Signed)
Pt states that she cannot urinate at this time.  Pt instructed to come to first nurse desk when she can urinate to provide specimen.  Pt verbalized understanding.

## 2016-05-02 NOTE — ED Provider Notes (Signed)
[facility] Emergency Department Provider Note  ____________________________________________   First MD Initiated Contact with Patient 05/02/16 0404     (approximate)  I have reviewed the triage vital signs and the nursing notes.   HISTORY  Chief Complaint Fall   HPI Prescott ParmaJanet Billups is a 60 y.o. female with a history of liver cirrhosis as well as chronic pain who is presenting to the emergency department tonight after a fall. She says that she was pulled out of her bed by Spirit at 3 AM yesterday. She says that she believes and spirits and that this has been a long time held believe of hers. She says this. Had also broken her foot several years ago. Her daughter is at the bedside and confirms this that her mother does believe in some. Said that she does not have any psychiatric diagnoses. The patient also denies taking any blood thinners. Says that since being pulled out of bed yesterday and hitting her head on a nightstand that she has had a worsening headache with dizziness and nausea but no vomiting. Says that she has also had 2 concussions in the past but her last concussion was years ago.Patient denies any drinking or drug use.   Past Medical History:  Diagnosis Date  . Asthma   . Chronic pain   . Gastric ulcer   . Generalized anxiety disorder   . Hepatitis C   . Hypokalemia   . Insomnia   . Muscle spasm   . Renal disorder   . Rheumatoid arthritis (HCC)   . TB (pulmonary tuberculosis) 1984    Patient Active Problem List   Diagnosis Date Noted  . Insomnia 06/30/2015  . Night muscle spasms 06/30/2015  . Hypokalemia 06/30/2015  . Asthma, mild intermittent 06/30/2015  . Generalized anxiety disorder 06/30/2015  . Chronic pain 06/30/2015  . Hepatitis C 06/30/2015    Past Surgical History:  Procedure Laterality Date  . CHOLECYSTECTOMY      Prior to Admission medications   Medication Sig Start Date End Date Taking? Authorizing Provider    albuterol (PROVENTIL HFA;VENTOLIN HFA) 108 (90 Base) MCG/ACT inhaler Inhale 2 puffs into the lungs every 6 (six) hours as needed. 06/30/15   Doreene NestKatherine K Clark, NP  amoxicillin-clavulanate (AUGMENTIN) 875-125 MG tablet Take 1 tablet by mouth 2 (two) times daily. 07/19/15   Jami L Hagler, PA-C  busPIRone (BUSPAR) 7.5 MG tablet Take 1 tablet (7.5 mg total) by mouth 2 (two) times daily. 06/30/15   Doreene NestKatherine K Clark, NP  carisoprodol (SOMA) 350 MG tablet Take 1 tablet (350 mg total) by mouth 4 (four) times daily. 06/30/15   Doreene NestKatherine K Clark, NP  diazepam (VALIUM) 10 MG tablet Take 10 mg by mouth every 8 (eight) hours as needed.     Historical Provider, MD  HYDROcodone-acetaminophen (NORCO) 5-325 MG tablet Take 1 tablet by mouth every 6 (six) hours as needed for severe pain. 07/19/15   Jami L Hagler, PA-C  magic mouthwash w/lidocaine SOLN Take 5 mLs by mouth 4 (four) times daily as needed for mouth pain. 07/19/15   Jami L Hagler, PA-C  potassium chloride SA (K-DUR,KLOR-CON) 20 MEQ tablet Take 1 tablet (20 mEq total) by mouth daily. 06/30/15 06/29/16  Doreene NestKatherine K Clark, NP  predniSONE (DELTASONE) 20 MG tablet Take 2 tablets by mouth, once daily, for 5 days 07/19/15   Jami L Hagler, PA-C  sucralfate (CARAFATE) 1 G tablet Take 1 tablet (1 g total) by mouth 4 (four) times daily. 03/03/15  Sharman Cheek, MD  traZODone (DESYREL) 100 MG tablet Take 1 tablet (100 mg total) by mouth at bedtime. 06/30/15   Doreene Nest, NP    Allergies Nsaids; Tolmetin; Tramadol; Iodine; Latex; Other; and Iodides  No family history on file.  Social History Social History  Substance Use Topics  . Smoking status: Former Games developer  . Smokeless tobacco: Never Used  . Alcohol use No    Review of Systems Constitutional: No fever/chills Eyes: No visual changes. ENT: No sore throat. Cardiovascular: Denies chest pain. Respiratory: Denies shortness of breath. Gastrointestinal: No abdominal pain.   no vomiting.  No diarrhea.  No  constipation. Genitourinary: Negative for dysuria. Musculoskeletal: Negative for back pain. Skin: Negative for rash. Neurological: Negative for focal weakness or numbness.  10-point ROS otherwise negative.  ____________________________________________   PHYSICAL EXAM:  VITAL SIGNS: ED Triage Vitals  Enc Vitals Group     BP 05/02/16 0223 (!) 108/59     Pulse Rate 05/02/16 0223 71     Resp 05/02/16 0223 16     Temp 05/02/16 0223 97.7 F (36.5 C)     Temp Source 05/02/16 0223 Oral     SpO2 05/02/16 0223 98 %     Weight 05/02/16 0226 100 lb (45.4 kg)     Height 05/02/16 0226 5\' 8"  (1.727 m)     Head Circumference --      Peak Flow --      Pain Score 05/02/16 0227 8     Pain Loc --      Pain Edu? --      Excl. in GC? --     Constitutional: Alert and oriented. Well appearing and in no acute distress. Eyes: Conjunctivae are normal. PERRL. EOMI. Head: Small abrasion of the left lateral forehead. No depression. No hematoma or bogginess. Nose: No congestion/rhinnorhea. Mouth/Throat: Mucous membranes are moist.   Neck: No stridor.  Moves her head and neck freely without any restriction or sign of pain. Cardiovascular: Normal rate, regular rhythm. Grossly normal heart sounds.   Respiratory: Normal respiratory effort.  No retractions. Lungs CTAB. Gastrointestinal: Soft and nontender. No distention.  Musculoskeletal: No lower extremity tenderness nor edema.  No joint effusions. Neurologic:  Normal speech and language. No gross focal neurologic deficits are appreciated. No nystagmus. 5 out of 5 strength throughout. Skin:  Skin is warm, dry and intact. No rash noted. Psychiatric: Mood and affect are normal. Speech and behavior are normal.  ____________________________________________   LABS (all labs ordered are listed, but only abnormal results are displayed)  Labs Reviewed  BASIC METABOLIC PANEL - Abnormal; Notable for the following:       Result Value   Glucose, Bld 108 (*)     All other components within normal limits  CBC  ETHANOL  URINE DRUG SCREEN, QUALITATIVE ([facility] ONLY)   ____________________________________________  EKG   ____________________________________________  RADIOLOGY    CT Head Wo Contrast (Final result)  Result time 05/02/16 03:34:20  Final result by Awilda Metro, MD (05/02/16 03:34:20)           Narrative:   CLINICAL DATA: Larey Seat, possibly struck LEFT side of head. Possible syncopal episode. History of ovarian, neck and stomach cancer, hepatitis-C.  EXAM: CT HEAD WITHOUT CONTRAST  TECHNIQUE: Contiguous axial images were obtained from the base of the skull through the vertex without intravenous contrast.  COMPARISON: Mandible radiographs July 19, 2015  FINDINGS: BRAIN: The ventricles and sulci are normal for age. No intraparenchymal hemorrhage, mass effect nor  midline shift. Patchy supratentorial white matter hypodensities within normal range for patient's age, though non-specific are most compatible with chronic small vessel ischemic disease. No acute large vascular territory infarcts. No abnormal extra-axial fluid collections. Basal cisterns are patent.  VASCULAR: Mild calcific atherosclerosis of the carotid siphons.  SKULL: No skull fracture. No significant scalp soft tissue swelling.  SINUSES/ORBITS: The mastoid air-cells and included paranasal sinuses are well-aerated.Old LEFT medial orbital blowout fracture. The included ocular globes and orbital contents are non-suspicious.  OTHER: None.  IMPRESSION: No acute intracranial process ; negative CT HEAD for age.   Electronically Signed By: Awilda Metroourtnay Bloomer M.D. On: 05/02/2016 03:34          ____________________________________________   PROCEDURES  Procedure(s) performed:   Procedures  Critical Care performed:   ____________________________________________   INITIAL IMPRESSION / ASSESSMENT AND PLAN / ED COURSE  Pertinent labs  & imaging results that were available during my care of the patient were reviewed by me and considered in my medical decision making (see chart for details).   Clinical Course   Patient is conscious alert and oriented. Likely concussion from her trauma yesterday. Patient did not clinically psychotic. Seems that her belief system includes spirits and this is not any sort of hallucination as would be diagnosed with a psychiatric disorder.  Discussed brain rest as well as concussion precautions.  Pt understands the plan and is willing to comply.   ____________________________________________   FINAL CLINICAL IMPRESSION(S) / ED DIAGNOSES  Concussion.    NEW MEDICATIONS STARTED DURING THIS VISIT:  New Prescriptions   No medications on file     Note:  This document was prepared using Dragon voice recognition software and may include unintentional dictation errors.    Myrna Blazeravid Matthew Karter Hellmer, MD 05/02/16 [phone]

## 2016-05-02 NOTE — ED Notes (Signed)
Pt states "i know I sound crazy but something is pulling me out of bed". Pt states "before I lived there, the woman told me that she had eight family members die in that house, maybe it's the spirits." pt states "i fell out of bed 25 hours ago, i've been awake for 25 hours." pt with abrasion noted to left forehead, abrasion noted to lateral left upper cheek. perrl 3mm brisk, pt able to move all extremities without difficulty. resps unlabored. Pt is alert and oriented x4.

## 2017-11-25 ENCOUNTER — Emergency Department: Payer: Medicare PPO

## 2017-11-25 ENCOUNTER — Emergency Department
Admission: EM | Admit: 2017-11-25 | Discharge: 2017-11-25 | Disposition: A | Discharge status: Home / Self Care | Payer: Medicare PPO | Attending: Emergency Medicine | Admitting: Emergency Medicine

## 2017-11-25 ENCOUNTER — Encounter: Payer: Self-pay | Admitting: Emergency Medicine

## 2017-11-25 ENCOUNTER — Other Ambulatory Visit: Payer: Self-pay

## 2017-11-25 DIAGNOSIS — J45909 Unspecified asthma, uncomplicated: Secondary | ICD-10-CM | POA: Diagnosis not present

## 2017-11-25 DIAGNOSIS — Y93E9 Activity, other interior property and clothing maintenance: Secondary | ICD-10-CM | POA: Insufficient documentation

## 2017-11-25 DIAGNOSIS — M70832 Other soft tissue disorders related to use, overuse and pressure, left forearm: Secondary | ICD-10-CM | POA: Insufficient documentation

## 2017-11-25 DIAGNOSIS — T148XXA Other injury of unspecified body region, initial encounter: Secondary | ICD-10-CM

## 2017-11-25 DIAGNOSIS — X503XXA Overexertion from repetitive movements, initial encounter: Secondary | ICD-10-CM

## 2017-11-25 DIAGNOSIS — M79602 Pain in left arm: Secondary | ICD-10-CM | POA: Insufficient documentation

## 2017-11-25 DIAGNOSIS — Z79899 Other long term (current) drug therapy: Secondary | ICD-10-CM | POA: Insufficient documentation

## 2017-11-25 MED ORDER — LIDOCAINE 5 % EX PTCH: 1.0000 | MEDICATED_PATCH | CUTANEOUS | 0 refills | Status: AC

## 2017-11-25 NOTE — ED Notes (Signed)
See triage note  Presents with pain to left forearm  States she has had screws placed in that arm several years   Developed pain to arm about 3-4 days ago  States she feel like a screw is loose  Unsure of injury  Good pulses but having increased pain with movement

## 2017-11-25 NOTE — ED Triage Notes (Signed)
C/O left fore arm pain x 3 days.  States has history of rod with pins to arm, states she thinks a "screw" has come loose.

## 2017-11-25 NOTE — ED Provider Notes (Signed)
[facility] Emergency Department Provider Note  ____________________________________________  Time seen: Approximately 10:25 AM  I have reviewed the triage vital signs and the nursing notes.   HISTORY  Chief Complaint Arm Pain    HPI Victoria Bonilla is a 62 y.o. female that presents to the emergency department for evaluation of arm pain for 3 days.  Pain is primarily over the top of her forearm and is more painful with movement.  Patient states that she is having pain over where she had rods and screws surgically placed 25 years ago.  Pain started after she was doing a lot of housework 3 days ago and pain worsened while plunging the toilet.  She hit her forearm on the steps when she tripped 3 days ago. She did not loose consciousness or hit her head. No alleviating measures have been attempted. No fever, chills.    Past Medical History:  Diagnosis Date  . Asthma   . Chronic pain   . Gastric ulcer   . Generalized anxiety disorder   . Hepatitis C   . Hypokalemia   . Insomnia   . Muscle spasm   . Renal disorder   . Rheumatoid arthritis (HCC)   . TB (pulmonary tuberculosis) 1984    Patient Active Problem List   Diagnosis Date Noted  . Insomnia 06/30/2015  . Night muscle spasms 06/30/2015  . Hypokalemia 06/30/2015  . Asthma, mild intermittent 06/30/2015  . Generalized anxiety disorder 06/30/2015  . Chronic pain 06/30/2015  . Hepatitis C 06/30/2015    Past Surgical History:  Procedure Laterality Date  . CHOLECYSTECTOMY      Prior to Admission medications   Medication Sig Start Date End Date Taking? Authorizing Provider  acetaminophen-codeine (TYLENOL #4) 300-60 MG tablet Take 1 tablet by mouth every 4 (four) hours as needed for pain.   Yes [provider]  temazepam (RESTORIL) 30 MG capsule Take 30 mg by mouth at bedtime as needed for sleep.   Yes [provider]  albuterol (PROVENTIL HFA;VENTOLIN HFA) 108 (90 Base) MCG/ACT  inhaler Inhale 2 puffs into the lungs every 6 (six) hours as needed. 06/30/15   Doreene Nest, NP  amoxicillin-clavulanate (AUGMENTIN) 875-125 MG tablet Take 1 tablet by mouth 2 (two) times daily. 07/19/15   Hagler, Jami L, PA-C  busPIRone (BUSPAR) 7.5 MG tablet Take 1 tablet (7.5 mg total) by mouth 2 (two) times daily. 06/30/15   Doreene Nest, NP  carisoprodol (SOMA) 350 MG tablet Take 1 tablet (350 mg total) by mouth 4 (four) times daily. 06/30/15   Doreene Nest, NP  diazepam (VALIUM) 10 MG tablet Take 10 mg by mouth every 8 (eight) hours as needed.     [provider]  HYDROcodone-acetaminophen (NORCO) 5-325 MG tablet Take 1 tablet by mouth every 6 (six) hours as needed for severe pain. 07/19/15   Hagler, Jami L, PA-C  lidocaine (LIDODERM) 5 % Place 1 patch onto the skin daily. Remove & Discard patch within 12 hours or as directed by MD 11/25/17   Enid Derry, PA-C  magic mouthwash w/lidocaine SOLN Take 5 mLs by mouth 4 (four) times daily as needed for mouth pain. 07/19/15   Hagler, Jami L, PA-C  potassium chloride SA (K-DUR,KLOR-CON) 20 MEQ tablet Take 1 tablet (20 mEq total) by mouth daily. 06/30/15 06/29/16  Doreene Nest, NP  predniSONE (DELTASONE) 20 MG tablet Take 2 tablets by mouth, once daily, for 5 days 07/19/15   Hagler, Jami L, PA-C  sucralfate (CARAFATE) 1 G tablet Take 1 tablet (1 g total) by mouth 4 (four) times daily. 03/03/15   Sharman Cheek, MD  traZODone (DESYREL) 100 MG tablet Take 1 tablet (100 mg total) by mouth at bedtime. 06/30/15   Doreene Nest, NP    Allergies Nsaids; Tolmetin; Tramadol; Iodine; Latex; Other; and Iodides  No family history on file.  Social History Social History   Tobacco Use  . Smoking status: Former Games developer  . Smokeless tobacco: Never Used  Substance Use Topics  . Alcohol use: No  . Drug use: No     Review of Systems  Constitutional: No fever/chills Cardiovascular: No chest pain. Respiratory: No  SOB. Gastrointestinal: No nausea, no vomiting.  Musculoskeletal: Positive for arm pain.  Skin: Negative for rash, abrasions, lacerations, ecchymosis. Neurological: Negative for numbness or tingling   ____________________________________________   PHYSICAL EXAM:  VITAL SIGNS: ED Triage Vitals  Enc Vitals Group     BP 11/25/17 1014 (!) 127/47     Pulse Rate 11/25/17 1014 62     Resp 11/25/17 1014 14     Temp 11/25/17 1014 98.6 F (37 C)     Temp Source 11/25/17 1014 Oral     SpO2 11/25/17 1014 100 %     Weight 11/25/17 1012 112 lb (50.8 kg)     Height 11/25/17 1012 5\' 8"  (1.727 m)     Head Circumference --      Peak Flow --      Pain Score 11/25/17 1012 7     Pain Loc --      Pain Edu? --      Excl. in GC? --      Constitutional: Alert and oriented. Well appearing and in no acute distress. Eyes: Conjunctivae are normal. PERRL. EOMI. Head: Atraumatic. ENT:      Ears:      Nose: No congestion/rhinnorhea.      Mouth/Throat: Mucous membranes are moist.  Neck: No stridor.  Cardiovascular: Normal rate, regular rhythm.  Good peripheral circulation. Symmetric radial pulses bilaterally. Respiratory: Normal respiratory effort without tachypnea or retractions. Lungs CTAB. Good air entry to the bases with no decreased or absent breath sounds. Musculoskeletal: Full range of motion to all extremities. No gross deformities appreciated. Tenderness to palpation over dorsal forearm. No swelling, erythema, bruising. Full ROM to wrist and elbow.  Neurologic:  Normal speech and language. No gross focal neurologic deficits are appreciated.  Skin:  Skin is warm, dry and intact. No rash noted. Large well healed scar to left forearm.  Psychiatric: Mood and affect are normal. Speech and behavior are normal. Patient exhibits appropriate insight and judgement.   ____________________________________________   LABS (all labs ordered are listed, but only abnormal results are displayed)  Labs  Reviewed - No data to display ____________________________________________  EKG   ____________________________________________  RADIOLOGY   Dg Forearm Left  Result Date: 11/25/2017 CLINICAL DATA:  Pain EXAM: LEFT FOREARM - 2 VIEW COMPARISON:  None. FINDINGS: Frontal and lateral views were obtained. There is evidence of screw and plate fixation in the mid ulnar region. No acute fracture or dislocation. No abnormal periosteal reaction. Joint spaces appear normal. No erosive change. IMPRESSION: Postoperative screw and plate fixation mid ulna. No fracture or dislocation. No blastic or lytic bone lesions. No abnormal periosteal reaction. No appreciable arthropathy. Electronically Signed   By: Bretta Bang III M.D.   On: 11/25/2017 11:04    ____________________________________________    PROCEDURES  Procedure(s) performed:  Procedures    Medications - No data to display   ____________________________________________   INITIAL IMPRESSION / ASSESSMENT AND PLAN / ED COURSE  Pertinent labs & imaging results that were available during my care of the patient were reviewed by me and considered in my medical decision making (see chart for details).  Review of the [place] CSRS was performed in accordance of the [facility] prior to dispensing any controlled drugs.     Patient presented to the emergency department for evaluation of arm pain. Symptoms are consistent with contusion after injury and inflammation from overuse. Exam is overall benign. Xray negative for acute changes. Patient takes muscle relaxers and will continue her home prescriptions.  Patient is to follow up with PCP as directed. Patient is given ED precautions to return to the ED for any worsening or new symptoms.     ____________________________________________  FINAL CLINICAL IMPRESSION(S) / ED DIAGNOSES  Final diagnoses:  Pain of left upper extremity  Overuse injury      NEW MEDICATIONS STARTED DURING THIS  VISIT:  ED Discharge Orders        Ordered    lidocaine (LIDODERM) 5 %  Every 24 hours     11/25/17 1150          This chart was dictated using voice recognition software/Dragon. Despite best efforts to proofread, errors can occur which can change the meaning. Any change was purely unintentional.    Enid DerryWagner, Rushton Early, PA-C 11/25/17 1543    Jene EveryKinner, Robert, MD 11/28/17 1355

## 2018-06-20 ENCOUNTER — Emergency Department
Admission: EM | Admit: 2018-06-20 | Discharge: 2018-06-20 | Disposition: A | Discharge status: Home / Self Care | Payer: Medicare PPO | Attending: Student in an Organized Health Care Education/Training Program | Admitting: Student in an Organized Health Care Education/Training Program

## 2018-06-20 ENCOUNTER — Emergency Department: Payer: Medicare PPO

## 2018-06-20 ENCOUNTER — Other Ambulatory Visit: Payer: Self-pay

## 2018-06-20 DIAGNOSIS — R0789 Other chest pain: Secondary | ICD-10-CM | POA: Diagnosis not present

## 2018-06-20 DIAGNOSIS — Z91040 Latex allergy status: Secondary | ICD-10-CM | POA: Diagnosis not present

## 2018-06-20 DIAGNOSIS — Z9049 Acquired absence of other specified parts of digestive tract: Secondary | ICD-10-CM | POA: Insufficient documentation

## 2018-06-20 DIAGNOSIS — Z87891 Personal history of nicotine dependence: Secondary | ICD-10-CM | POA: Insufficient documentation

## 2018-06-20 DIAGNOSIS — R079 Chest pain, unspecified: Secondary | ICD-10-CM | POA: Diagnosis present

## 2018-06-20 DIAGNOSIS — Z79899 Other long term (current) drug therapy: Secondary | ICD-10-CM | POA: Diagnosis not present

## 2018-06-20 DIAGNOSIS — J45909 Unspecified asthma, uncomplicated: Secondary | ICD-10-CM | POA: Insufficient documentation

## 2018-06-20 DIAGNOSIS — F411 Generalized anxiety disorder: Secondary | ICD-10-CM | POA: Diagnosis not present

## 2018-06-20 LAB — COMPREHENSIVE METABOLIC PANEL
ALK PHOS: 46 U/L (ref 38–126)
ALT: 10 U/L (ref 0–44)
ANION GAP: 5 (ref 5–15)
AST: 14 U/L — ABNORMAL LOW (ref 15–41)
Albumin: 4 g/dL (ref 3.5–5.0)
BILIRUBIN TOTAL: 0.7 mg/dL (ref 0.3–1.2)
BUN: 8 mg/dL (ref 8–23)
CALCIUM: 8.6 mg/dL — AB (ref 8.9–10.3)
CO2: 29 mmol/L (ref 22–32)
Chloride: 104 mmol/L (ref 98–111)
Creatinine, Ser: 0.63 mg/dL (ref 0.44–1.00)
Glucose, Bld: 89 mg/dL (ref 70–99)
Potassium: 3.7 mmol/L (ref 3.5–5.1)
SODIUM: 138 mmol/L (ref 135–145)
TOTAL PROTEIN: 7.1 g/dL (ref 6.5–8.1)

## 2018-06-20 LAB — CBC WITH DIFFERENTIAL/PLATELET
Abs Immature Granulocytes: 0.01 10*3/uL (ref 0.00–0.07)
Basophils Absolute: 0.1 10*3/uL (ref 0.0–0.1)
Basophils Relative: 1 %
EOS PCT: 3 %
Eosinophils Absolute: 0.1 10*3/uL (ref 0.0–0.5)
HCT: 42.7 % (ref 36.0–46.0)
HEMOGLOBIN: 14.3 g/dL (ref 12.0–15.0)
Immature Granulocytes: 0 %
LYMPHS PCT: 27 %
Lymphs Abs: 1.4 10*3/uL (ref 0.7–4.0)
MCH: 33.2 pg (ref 26.0–34.0)
MCHC: 33.5 g/dL (ref 30.0–36.0)
MCV: 99.1 fL (ref 80.0–100.0)
MONO ABS: 0.4 10*3/uL (ref 0.1–1.0)
Monocytes Relative: 7 %
Neutro Abs: 3.1 10*3/uL (ref 1.7–7.7)
Neutrophils Relative %: 62 %
Platelets: 143 10*3/uL — ABNORMAL LOW (ref 150–400)
RBC: 4.31 MIL/uL (ref 3.87–5.11)
RDW: 12 % (ref 11.5–15.5)
WBC: 5 10*3/uL (ref 4.0–10.5)
nRBC: 0 % (ref 0.0–0.2)

## 2018-06-20 LAB — TROPONIN I

## 2018-06-20 MED ORDER — MORPHINE SULFATE (PF) 4 MG/ML IV SOLN
6.0000 mg | INTRAVENOUS | Status: DC | PRN
  Administered 2018-06-20: 6 mg via INTRAMUSCULAR
  Filled 2018-06-20: qty 2

## 2018-06-20 MED ORDER — LIDOCAINE 5 % EX PTCH: 1.0000 | MEDICATED_PATCH | Freq: Two times a day (BID) | CUTANEOUS | 0 refills | Status: AC

## 2018-06-20 MED ORDER — HYDROCODONE-ACETAMINOPHEN 5-325 MG PO TABS
1.0000 | ORAL_TABLET | Freq: Once | ORAL | Status: AC
  Administered 2018-06-20: 1 via ORAL
  Filled 2018-06-20: qty 1

## 2018-06-20 MED ORDER — LIDOCAINE 5 % EX PTCH
1.0000 | MEDICATED_PATCH | CUTANEOUS | Status: DC
  Administered 2018-06-20: 1 via TRANSDERMAL
  Filled 2018-06-20 (×2): qty 1

## 2018-06-20 MED ORDER — HYDROCODONE-ACETAMINOPHEN 5-325 MG PO TABS: 1.0000 | ORAL_TABLET | ORAL | 0 refills | Status: DC | PRN

## 2018-06-20 NOTE — ED Triage Notes (Signed)
Pt c/o painful inspiration of the right upper back/chest that she woke up with yesterday morning, denies injury or recent cough

## 2018-06-20 NOTE — ED Provider Notes (Signed)
[facility] Emergency Department Provider Note    First MD Initiated Contact with Patient 06/20/18 1638     (approximate)  I have reviewed the triage vital signs and the nursing notes.   HISTORY  Chief Complaint Pleurisy    HPI Victoria Bonilla is a 63 y.o. 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  Denies any leg swelling.  No fevers.  No cough.  Pain is worsened with movement.  Denies any trauma.  No recent fevers.  States that she does smoke.    Past Medical History:  Diagnosis Date  . Asthma   . Chronic pain   . Gastric ulcer   . Generalized anxiety disorder   . Hepatitis C   . Hypokalemia   . Insomnia   . Muscle spasm   . Renal disorder   . Rheumatoid arthritis (HCC)   . TB (pulmonary tuberculosis) 1984   No family history on file. Past Surgical History:  Procedure Laterality Date  . CHOLECYSTECTOMY     Patient Active Problem List   Diagnosis Date Noted  . Insomnia 06/30/2015  . Night muscle spasms 06/30/2015  . Hypokalemia 06/30/2015  . Asthma, mild intermittent 06/30/2015  . Generalized anxiety disorder 06/30/2015  . Chronic pain 06/30/2015  . Hepatitis C 06/30/2015      Prior to Admission medications   Medication Sig Start Date End Date Taking? Authorizing Provider  acetaminophen-codeine (TYLENOL #4) 300-60 MG tablet Take 1 tablet by mouth every 4 (four) hours as needed for pain.    [provider]  albuterol (PROVENTIL HFA;VENTOLIN HFA) 108 (90 Base) MCG/ACT inhaler Inhale 2 puffs into the lungs every 6 (six) hours as needed. 06/30/15   Doreene Nest, NP  amoxicillin-clavulanate (AUGMENTIN) 875-125 MG tablet Take 1 tablet by mouth 2 (two) times daily. 07/19/15   Hagler, Jami L, PA-C    busPIRone (BUSPAR) 7.5 MG tablet Take 1 tablet (7.5 mg total) by mouth 2 (two) times daily. 06/30/15   Doreene Nest, NP  carisoprodol (SOMA) 350 MG tablet Take 1 tablet (350 mg total) by mouth 4 (four) times daily. 06/30/15   Doreene Nest, NP  diazepam (VALIUM) 10 MG tablet Take 10 mg by mouth every 8 (eight) hours as needed.     [provider]  HYDROcodone-acetaminophen (NORCO) 5-325 MG tablet Take 1 tablet by mouth every 6 (six) hours as needed for severe pain. 07/19/15   Hagler, Jami L, PA-C  HYDROcodone-acetaminophen (NORCO) 5-325 MG tablet Take 1 tablet by mouth every 4 (four) hours as needed for moderate pain. 06/20/18   Willy Eddy, MD  lidocaine (LIDODERM) 5 % Place 1 patch onto the skin daily. Remove & Discard patch within 12 hours or as directed by MD 11/25/17   Enid Derry, PA-C  lidocaine (LIDODERM) 5 % Place 1 patch onto the skin every 12 (twelve) hours. Remove & Discard patch within 12 hours or as directed by MD 06/20/18 06/20/19  Willy Eddy, MD  magic mouthwash w/lidocaine SOLN Take 5 mLs by mouth 4 (four) times daily as needed for mouth pain. 07/19/15   Hagler, Jami L, PA-C  potassium chloride SA (K-DUR,KLOR-CON) 20 MEQ tablet Take 1 tablet (20 mEq total) by mouth daily. 06/30/15 06/29/16  Doreene Nest, NP  predniSONE (DELTASONE) 20 MG tablet Take 2 tablets by mouth, once daily, for 5 days 07/19/15   Hagler, Jami L, PA-C  sucralfate (CARAFATE) 1 G tablet Take 1 tablet (1 g total) by mouth 4 (four) times daily. 03/03/15   Sharman CheekStafford, Phillip, MD  temazepam (RESTORIL) 30 MG capsule Take 30 mg by mouth at bedtime as needed for sleep.    [provider]  traZODone (DESYREL) 100 MG tablet Take 1 tablet (100 mg total) by mouth at bedtime. 06/30/15   Doreene Nestlark, Katherine K, NP    Allergies Nsaids; Tolmetin; Tramadol; Iodine; Latex; Other; and Iodides    Social History Social History   Tobacco Use  . Smoking status: Former Games developermoker  . Smokeless tobacco:  Never Used  Substance Use Topics  . Alcohol use: No  . Drug use: No    Review of Systems Patient denies headaches, rhinorrhea, blurry vision, numbness, shortness of breath, chest pain, edema, cough, abdominal pain, nausea, vomiting, diarrhea, dysuria, fevers, rashes or hallucinations unless otherwise stated above in HPI. ____________________________________________   PHYSICAL EXAM:  VITAL SIGNS: Vitals:   06/20/18 1513  BP: (!) 121/54  Pulse: (!) 54  Resp: 17  Temp: 98.2 F (36.8 C)  SpO2: 100%    Constitutional: Alert and oriented.  Eyes: Conjunctivae are normal.  Head: Atraumatic. Nose: No congestion/rhinnorhea. Mouth/Throat: Mucous membranes are moist.   Neck: No stridor. Painless ROM.  Cardiovascular: Normal rate, regular rhythm. Grossly normal heart sounds.  Good peripheral circulation. Respiratory: Normal respiratory effort.  No retractions. Lungs CTAB. Gastrointestinal: Soft and nontender. No distention. No abdominal bruits. No CVA tenderness. Genitourinary:  Musculoskeletal: Tenderness to palpation of the right trapezius as well as right anterior chest wall without any crepitus ecchymosis or evidence of trauma.  No lower extremity tenderness nor edema.  No joint effusions. Neurologic:  Normal speech and language. No gross focal neurologic deficits are appreciated. No facial droop Skin:  Skin is warm, dry and intact. No rash noted. Psychiatric: Mood and affect are normal. Speech and behavior are normal.  ____________________________________________   LABS (all labs ordered are listed, but only abnormal results are displayed)  Results for orders placed or performed during the hospital encounter of 06/20/18 (from the past 24 hour(s))  CBC with Differential/Platelet     Status: Abnormal   Collection Time: 06/20/18  5:41 PM  Result Value Ref Range   WBC 5.0 4.0 - 10.5 K/uL   RBC 4.31 3.87 - 5.11 MIL/uL   Hemoglobin 14.3 12.0 - 15.0 g/dL   HCT 57.842.7 46.936.0 - 62.946.0 %     MCV 99.1 80.0 - 100.0 fL   MCH 33.2 26.0 - 34.0 pg   MCHC 33.5 30.0 - 36.0 g/dL   RDW 52.812.0 41.311.5 - 24.415.5 %   Platelets 143 (L) 150 - 400 K/uL   nRBC 0.0 0.0 - 0.2 %   Neutrophils Relative % 62 %   Neutro Abs 3.1 1.7 - 7.7 K/uL   Lymphocytes Relative 27 %   Lymphs Abs 1.4 0.7 - 4.0 K/uL   Monocytes Relative 7 %   Monocytes Absolute 0.4 0.1 - 1.0 K/uL   Eosinophils Relative 3 %   Eosinophils Absolute 0.1 0.0 - 0.5 K/uL   Basophils Relative 1 %   Basophils Absolute 0.1 0.0 - 0.1 K/uL   Immature Granulocytes 0 %   Abs Immature Granulocytes 0.01 0.00 - 0.07 K/uL  Comprehensive metabolic panel     Status: Abnormal   Collection Time: 06/20/18  5:41 PM  Result Value Ref Range   Sodium 138 135 - 145 mmol/L   Potassium 3.7 3.5 - 5.1 mmol/L   Chloride 104 98 - 111 mmol/L   CO2 29 22 - 32 mmol/L   Glucose, Bld 89 70 - 99 mg/dL   BUN 8 8 - 23 mg/dL   Creatinine, Ser 6.96 0.44 - 1.00 mg/dL   Calcium 8.6 (L) 8.9 - 10.3 mg/dL   Total Protein 7.1 6.5 - 8.1 g/dL   Albumin 4.0 3.5 - 5.0 g/dL   AST 14 (L) 15 - 41 U/L   ALT 10 0 - 44 U/L   Alkaline Phosphatase 46 38 - 126 U/L   Total Bilirubin 0.7 0.3 - 1.2 mg/dL   GFR calc non Af Amer >60 >60 mL/Victoria   GFR calc Af Amer >60 >60 mL/Victoria   Anion gap 5 5 - 15  Troponin I - ONCE - STAT     Status: None   Collection Time: 06/20/18  5:41 PM  Result Value Ref Range   Troponin I <0.03 <0.03 ng/mL   ____________________________________________  EKG My review and personal interpretation at Time: 18:28   Indication: chest wall pain  Rate: 50  Rhythm: sinus Axis: normal Other: normal intervals, no stemi ____________________________________________  RADIOLOGY  I personally reviewed all radiographic images ordered to evaluate for the above acute complaints and reviewed radiology reports and findings.  These findings were personally discussed with the patient.  Please see medical record for radiology  report.  ____________________________________________   PROCEDURES  Procedure(s) performed:  Procedures    Critical Care performed: no ____________________________________________   INITIAL IMPRESSION / ASSESSMENT AND PLAN / ED COURSE  Pertinent labs & imaging results that were available during my care of the patient were reviewed by me and considered in my medical decision making (see chart for details).   DDX: ACS, pericarditis, esophagitis, boerhaaves, pe, dissection, pna, bronchitis, costochondritis   Victoria Bonilla is a 63 y.o. who presents to the ED with symptoms as described above.  Seems primarily musculoskeletal.  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  Patient tearful.  Describes pleuritic pain but is a hemophiliac therefore have a low suspicion for PE particularly given lack of hypoxia, lower extremity swelling or risk factors for DVT.  Not consistent with dissection.  She has good pulses throughout.  Pain reproduced with palpation of the right posterior shoulder.  No evidence of lesions to suggest shingles.  Clinical Course as of Jun 20 1904  Tue Jun 20, 2018  1836 Troponin is negative.  This does not seem clinically consistent with ACS.  Pain is reproduced with palpation and with movement.  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  Having some improvement with pain medication but based on her discomfort I do think that a dose of IM morphine indicated.   [PR]  1905 She remains in no acute distress.  Does not have any significant wheeze cough or respiratory symptoms.  Doubt bronchitis.  No evidence of pneumonia.  Not consistent with dissection.  At this point believe she stable and appropriate for outpatient follow-up with referral to pain management clinic.   [PR]    Clinical Course User Index [PR] Willy Eddy, MD     As part of my medical decision making, I reviewed the following data within the electronic  MEDICAL RECORD NUMBER Nursing notes reviewed and incorporated, Labs reviewed, notes from prior ED visits and [place] Controlled Substance Database   ____________________________________________  FINAL CLINICAL IMPRESSION(S) / ED DIAGNOSES  Final diagnoses:  Right-sided chest wall pain      NEW MEDICATIONS STARTED DURING THIS VISIT:  New Prescriptions   HYDROCODONE-ACETAMINOPHEN (NORCO) 5-325 MG TABLET    Take 1 tablet by mouth every 4 (four) hours as needed for moderate pain.   LIDOCAINE (LIDODERM) 5 %    Place 1 patch onto the skin every 12 (twelve) hours. Remove & Discard patch within 12 hours or as directed by MD     Note:  This document was prepared using Dragon voice recognition software and may include unintentional dictation errors.    Willy Eddyobinson, Aleighna Wojtas, MD 06/20/18 [phone]

## 2018-08-16 DEATH — deceased

## 2019-07-06 ENCOUNTER — Other Ambulatory Visit: Payer: Self-pay

## 2019-07-06 ENCOUNTER — Emergency Department
Admission: EM | Admit: 2019-07-06 | Discharge: 2019-07-06 | Disposition: A | Discharge status: Home / Self Care | Payer: Medicare PPO | Attending: Emergency Medicine | Admitting: Emergency Medicine

## 2019-07-06 ENCOUNTER — Encounter: Payer: Self-pay | Admitting: Emergency Medicine

## 2019-07-06 DIAGNOSIS — Z5321 Procedure and treatment not carried out due to patient leaving prior to being seen by health care provider: Secondary | ICD-10-CM | POA: Insufficient documentation

## 2019-07-06 DIAGNOSIS — M79604 Pain in right leg: Secondary | ICD-10-CM | POA: Insufficient documentation

## 2019-07-06 DIAGNOSIS — M549 Dorsalgia, unspecified: Secondary | ICD-10-CM | POA: Insufficient documentation

## 2019-07-06 NOTE — ED Triage Notes (Signed)
Pt states that she woke up x4 days ago with pain radiating from her back all the way down the right leg. Pt denies SOB or trauma of any kind. Pt is in NAD.

## 2019-07-06 NOTE — ED Notes (Signed)
Pt reports "I'm going to to be here waiting until midnight aren't I?"  Pt told about the difficulty to predict wait times for pt's   Pt reports "I'm leaving" -- this was at 2143  Pt had ready bed at 2146, NOEL RN unable to catch pt in time in parking lot

## 2019-07-09 ENCOUNTER — Ambulatory Visit: Payer: Medicare PPO | Attending: Internal Medicine

## 2019-07-09 DIAGNOSIS — Z23 Encounter for immunization: Secondary | ICD-10-CM | POA: Insufficient documentation

## 2019-07-09 NOTE — Progress Notes (Signed)
   Covid-19 Vaccination Clinic  Name:  Sarahgrace Broman    MRN: [id number] DOB: 1956-05-16  07/09/2019  Ms. Blowe was observed post Covid-19 immunization for 15 minutes without incidence. She was provided with Vaccine Information Sheet and instruction to access the V-Safe system.   Ms. Mcmeans was instructed to call 911 with any severe reactions post vaccine: Marland Kitchen Difficulty breathing  . Swelling of your face and throat  . A fast heartbeat  . A bad rash all over your body  . Dizziness and weakness    Immunizations Administered    Name Date Dose VIS Date Route   Moderna COVID-19 Vaccine 07/09/2019  1:11 PM 0.5 mL 04/17/2019 Intramuscular   Manufacturer: Moderna   Lot: [id number]   NDC: 26691-675-61

## 2019-08-07 ENCOUNTER — Ambulatory Visit: Payer: Medicare PPO | Attending: Internal Medicine

## 2019-08-07 DIAGNOSIS — Z23 Encounter for immunization: Secondary | ICD-10-CM

## 2019-08-07 NOTE — Progress Notes (Signed)
   Covid-19 Vaccination Clinic  Name:  Victoria Bonilla    MRN: [id number] DOB: [birth year]  08/07/2019  Victoria Bonilla was observed post Covid-19 immunization for 15 minutes without incident. She was provided with Vaccine Information Sheet and instruction to access the V-Safe system.   Victoria Bonilla was instructed to call 911 with any severe reactions post vaccine: Marland Kitchen Difficulty breathing  . Swelling of face and throat  . A fast heartbeat  . A bad rash all over body  . Dizziness and weakness   Immunizations Administered    Name Date Dose VIS Date Route   Moderna COVID-19 Vaccine 08/07/2019 11:53 AM 0.5 mL 04/17/2019 Intramuscular   Manufacturer: Gala Murdoch   Lot: [id number]   NDC: 85277-824-23

## 2020-12-08 ENCOUNTER — Emergency Department: Payer: Medicare PPO

## 2020-12-08 ENCOUNTER — Other Ambulatory Visit: Payer: Self-pay

## 2020-12-08 ENCOUNTER — Encounter: Payer: Self-pay | Admitting: Emergency Medicine

## 2020-12-08 ENCOUNTER — Emergency Department
Admission: EM | Admit: 2020-12-08 | Discharge: 2020-12-08 | Disposition: A | Discharge status: Home / Self Care | Payer: Medicare PPO | Attending: Emergency Medicine | Admitting: Emergency Medicine

## 2020-12-08 DIAGNOSIS — S0003XA Contusion of scalp, initial encounter: Secondary | ICD-10-CM | POA: Insufficient documentation

## 2020-12-08 DIAGNOSIS — W109XXA Fall (on) (from) unspecified stairs and steps, initial encounter: Secondary | ICD-10-CM | POA: Insufficient documentation

## 2020-12-08 DIAGNOSIS — S0990XA Unspecified injury of head, initial encounter: Secondary | ICD-10-CM | POA: Diagnosis present

## 2020-12-08 DIAGNOSIS — Z87891 Personal history of nicotine dependence: Secondary | ICD-10-CM | POA: Insufficient documentation

## 2020-12-08 DIAGNOSIS — J45909 Unspecified asthma, uncomplicated: Secondary | ICD-10-CM | POA: Diagnosis not present

## 2020-12-08 DIAGNOSIS — Z91040 Latex allergy status: Secondary | ICD-10-CM | POA: Diagnosis not present

## 2020-12-08 DIAGNOSIS — W19XXXA Unspecified fall, initial encounter: Secondary | ICD-10-CM

## 2020-12-08 HISTORY — DX: Hepatic failure, unspecified without coma: K72.90

## 2020-12-08 NOTE — ED Triage Notes (Addendum)
Pt comes into the ED via POV c/o fall 5 days ago down a flight of stairs.  PT hit her head and since then has been having left side headaches.  Pt denies any unilateral weakness or sensory problems.  PT does have some nausea and dizziness.  PT in NAD at this time. Pt denies any blood thinners.

## 2020-12-08 NOTE — ED Provider Notes (Signed)
[facility] Emergency Department Provider Note   ____________________________________________   Event Date/Time   First MD Initiated Contact with Patient 12/08/20 0932     (approximate)  I have reviewed the triage vital signs and the nursing notes.   HISTORY  Chief Complaint Fall and Headache    HPI Victoria Bonilla is a 65 y.o. female With the below stated past medical history who presents for a persistent left-sided headache after a fall  LOCATION: Left temporal head DURATION: 4 days prior to arrival TIMING: Stable since onset SEVERITY: 10/10 QUALITY: Aching/throbbing CONTEXT: Patient states that she had a fall down some steps 4 days prior to arrival in which she struck her head against the wall within the steps MODIFYING FACTORS: Palpation worsens this pain and is partially relieved with muscle relaxants and NSAIDs ASSOCIATED SYMPTOMS: Denies any vision changes or tinnitus   Per medical record review, noncontributory past medical history          Past Medical History:  Diagnosis Date   Asthma    Chronic pain    Gastric ulcer    Generalized anxiety disorder    Hepatitis C    Hypokalemia    Insomnia    Liver failure (HCC)    Muscle spasm    Renal disorder    Rheumatoid arthritis (HCC)    TB (pulmonary tuberculosis) 1984    Patient Active Problem List   Diagnosis Date Noted   Insomnia 06/30/2015   Night muscle spasms 06/30/2015   Hypokalemia 06/30/2015   Asthma, mild intermittent 06/30/2015   Generalized anxiety disorder 06/30/2015   Chronic pain 06/30/2015   Hepatitis C 06/30/2015    Past Surgical History:  Procedure Laterality Date   CHOLECYSTECTOMY      Prior to Admission medications   Medication Sig Start Date End Date Taking? Authorizing Provider  acetaminophen-codeine (TYLENOL #4) 300-60 MG tablet Take 1 tablet by mouth every 4 (four) hours as needed for pain.    [provider]  albuterol (PROVENTIL  HFA;VENTOLIN HFA) 108 (90 Base) MCG/ACT inhaler Inhale 2 puffs into the lungs every 6 (six) hours as needed. 06/30/15   Doreene Nest, NP  amoxicillin-clavulanate (AUGMENTIN) 875-125 MG tablet Take 1 tablet by mouth 2 (two) times daily. 07/19/15   Hagler, Jami L, PA-C  busPIRone (BUSPAR) 7.5 MG tablet Take 1 tablet (7.5 mg total) by mouth 2 (two) times daily. 06/30/15   Doreene Nest, NP  carisoprodol (SOMA) 350 MG tablet Take 1 tablet (350 mg total) by mouth 4 (four) times daily. 06/30/15   Doreene Nest, NP  diazepam (VALIUM) 10 MG tablet Take 10 mg by mouth every 8 (eight) hours as needed.     [provider]  HYDROcodone-acetaminophen (NORCO) 5-325 MG tablet Take 1 tablet by mouth every 6 (six) hours as needed for severe pain. 07/19/15   Hagler, Jami L, PA-C  HYDROcodone-acetaminophen (NORCO) 5-325 MG tablet Take 1 tablet by mouth every 4 (four) hours as needed for moderate pain. 06/20/18   Willy Eddy, MD  lidocaine (LIDODERM) 5 % Place 1 patch onto the skin daily. Remove & Discard patch within 12 hours or as directed by MD 11/25/17   Enid Derry, PA-C  magic mouthwash w/lidocaine SOLN Take 5 mLs by mouth 4 (four) times daily as needed for mouth pain. 07/19/15   Hagler, Jami L, PA-C  potassium chloride SA (K-DUR,KLOR-CON) 20 MEQ tablet Take 1 tablet (20 mEq total) by mouth daily. 06/30/15 06/29/16  Vernona Rieger  K, NP  predniSONE (DELTASONE) 20 MG tablet Take 2 tablets by mouth, once daily, for 5 days 07/19/15   Hagler, Jami L, PA-C  sucralfate (CARAFATE) 1 G tablet Take 1 tablet (1 g total) by mouth 4 (four) times daily. 03/03/15   Sharman Cheek, MD  temazepam (RESTORIL) 30 MG capsule Take 30 mg by mouth at bedtime as needed for sleep.    [provider]  traZODone (DESYREL) 100 MG tablet Take 1 tablet (100 mg total) by mouth at bedtime. 06/30/15   Doreene Nest, NP    Allergies Nsaids, Tolmetin, Tramadol, Iodine, Latex, Other, and Iodides  History  reviewed. No pertinent family history.  Social History Social History   Tobacco Use   Smoking status: Former   Smokeless tobacco: Never  Substance Use Topics   Alcohol use: No   Drug use: No    Review of Systems Constitutional: No fever/chills Eyes: No visual changes. ENT: No sore throat. Cardiovascular: Denies chest pain. Respiratory: Denies shortness of breath. Gastrointestinal: No abdominal pain.  No nausea, no vomiting.  No diarrhea. Genitourinary: Negative for dysuria. Musculoskeletal: Negative for acute arthralgias Skin: Negative for rash. Neurological: Positive for headaches, negative for weakness/numbness/paresthesias in any extremity Psychiatric: Negative for suicidal ideation/homicidal ideation   ____________________________________________   PHYSICAL EXAM:  VITAL SIGNS: ED Triage Vitals  Enc Vitals Group     BP 12/08/20 0915 (!) 117/58     Pulse Rate 12/08/20 0915 (!) 50     Resp 12/08/20 0915 18     Temp 12/08/20 0915 97.6 F (36.4 C)     Temp Source 12/08/20 0915 Oral     SpO2 12/08/20 0915 100 %     Weight 12/08/20 0916 97 lb (44 kg)     Height 12/08/20 0916 5\' 7"  (1.702 m)     Head Circumference --      Peak Flow --      Pain Score 12/08/20 0915 9     Pain Loc --      Pain Edu? --      Excl. in GC? --    Constitutional: Alert and oriented. Well appearing and in no acute distress. Eyes: Conjunctivae are normal. PERRL. Head: Contusion appreciated the left side of this temporal scalp with tenderness to palpation Nose: No congestion/rhinnorhea. Mouth/Throat: Mucous membranes are moist. Neck: No stridor Cardiovascular: Grossly normal heart sounds.  Good peripheral circulation. Respiratory: Normal respiratory effort.  No retractions. Gastrointestinal: Soft and nontender. No distention. Musculoskeletal: No obvious deformities Neurologic:  Normal speech and language. No gross focal neurologic deficits are appreciated. Skin: Contusion noted to the  left temporal scalp.  Skin is warm and dry. No rash noted. Psychiatric: Mood and affect are normal. Speech and behavior are normal.  ____________________________________________   LABS (all labs ordered are listed, but only abnormal results are displayed)  Labs Reviewed - No data to display  RADIOLOGY  ED MD interpretation: CT of the head without contrast shows no evidence of acute abnormalities including no intracerebral hemorrhage, obvious masses, or significant edema  Official radiology report(s): CT HEAD WO CONTRAST  Result Date: 12/08/2020 CLINICAL DATA:  Head trauma with subsequent left-sided headaches for 5 days. EXAM: CT HEAD WITHOUT CONTRAST TECHNIQUE: Contiguous axial images were obtained from the base of the skull through the vertex without intravenous contrast. COMPARISON:  05/02/2016 FINDINGS: Brain: No evidence of acute infarction, hemorrhage, hydrocephalus, extra-axial collection or mass lesion/mass effect. Vascular: No hyperdense vessel or unexpected calcification. Skull: Negative for fracture. Sinuses/Orbits: No  evidence of injury IMPRESSION: No evidence of intracranial injury or fracture. Electronically Signed   By: Marnee Spring M.D.   On: 12/08/2020 10:18    ____________________________________________   PROCEDURES  Procedure(s) performed (including Critical Care):  .1-3 Lead EKG Interpretation  Date/Time: 12/08/2020 10:40 AM Performed by: Merwyn Katos, MD Authorized by: Merwyn Katos, MD     Interpretation: normal     ECG rate:  55   ECG rate assessment: normal     Rhythm: sinus rhythm     Ectopy: none     Conduction: normal     ____________________________________________   INITIAL IMPRESSION / ASSESSMENT AND PLAN / ED COURSE  As part of my medical decision making, I reviewed the following data within the electronic medical record, if available:  Nursing notes reviewed and incorporated, Labs reviewed, EKG interpreted, Old chart reviewed,  Radiograph reviewed and Notes from prior ED visits reviewed and incorporated        Patient presenting with head trauma.  Patient's neurological exam was non-focal and unremarkable.  [place] Head CT Rule was applied and patient did not fall into the low risk category so a head CT was obtained.  This showed no significant findings.  At this time, it is felt that the most likely explanation for the patient's symptoms is concussion.   I also considered SAH, SDH, Epidural Hematoma, IPH, skull fracture, migraine but this appears less likely considering the data gathered thus far.   Patient provided reassurance.   Patient remained stable and neurologically intact while in the emergency department.  Discussed warning signs that would prompt return to ED.  Head trauma handout was provided.  Discussed in detail concussion management.  No sports or strenuous activity until symptoms free.  Return to emergency department urgently if new or worsening symptoms develop.    Impression:  Concussion Scalp contusion  Plan  Discharge from ED Tylenol for pain control. Avoid aspirin, NSAIDs, or other blood thinners. Advised patient on supportive measures for cognitive rest - avoid use of cognitive function for at least 24 hours.  This means no tv, books, texting, computers, etc. Limit visitors to the house.  Head trauma instructions provided in discharge instructions Instructed Pt to monitor for neurologic symptoms, severe HA, change in mental status, seizures, loss of conciousness. Instructed Pt to f/up w/ PCP in 3 days or ETC should symptoms worsen or not improve. Pt verbally expressed understanding and all questions were addressed to Pt's satisfaction.      ____________________________________________   FINAL CLINICAL IMPRESSION(S) / ED DIAGNOSES  Final diagnoses:  Injury of head, initial encounter  Fall, initial encounter     ED Discharge Orders     None        Note:  This document was  prepared using Dragon voice recognition software and may include unintentional dictation errors.    Merwyn Katos, MD 12/08/20 1040

## 2020-12-08 NOTE — Discharge Instructions (Addendum)
Please continue using NSAIDs (ibuprofen/naproxen) in addition to your muscle relaxers for any continued head pain.

## 2020-12-21 ENCOUNTER — Encounter: Payer: Self-pay | Admitting: Emergency Medicine

## 2020-12-21 ENCOUNTER — Emergency Department: Payer: Medicare PPO

## 2020-12-21 ENCOUNTER — Emergency Department
Admission: EM | Admit: 2020-12-21 | Discharge: 2020-12-21 | Disposition: A | Discharge status: Home / Self Care | Payer: Medicare PPO | Attending: Emergency Medicine | Admitting: Emergency Medicine

## 2020-12-21 ENCOUNTER — Other Ambulatory Visit: Payer: Self-pay

## 2020-12-21 DIAGNOSIS — J4520 Mild intermittent asthma, uncomplicated: Secondary | ICD-10-CM | POA: Diagnosis not present

## 2020-12-21 DIAGNOSIS — Z87891 Personal history of nicotine dependence: Secondary | ICD-10-CM | POA: Insufficient documentation

## 2020-12-21 DIAGNOSIS — W010XXA Fall on same level from slipping, tripping and stumbling without subsequent striking against object, initial encounter: Secondary | ICD-10-CM | POA: Insufficient documentation

## 2020-12-21 DIAGNOSIS — S42295A Other nondisplaced fracture of upper end of left humerus, initial encounter for closed fracture: Secondary | ICD-10-CM | POA: Diagnosis not present

## 2020-12-21 DIAGNOSIS — S4992XA Unspecified injury of left shoulder and upper arm, initial encounter: Secondary | ICD-10-CM | POA: Diagnosis present

## 2020-12-21 DIAGNOSIS — Z91040 Latex allergy status: Secondary | ICD-10-CM | POA: Diagnosis not present

## 2020-12-21 MED ORDER — HYDROCODONE-ACETAMINOPHEN 5-325 MG PO TABS: 1.0000 | ORAL_TABLET | Freq: Four times a day (QID) | ORAL | 0 refills | Status: AC | PRN

## 2020-12-21 MED ORDER — HYDROCODONE-ACETAMINOPHEN 5-325 MG PO TABS: 1.0000 | ORAL_TABLET | Freq: Once | ORAL | Status: DC

## 2020-12-21 MED ORDER — MORPHINE SULFATE (PF) 4 MG/ML IV SOLN
4.0000 mg | Freq: Once | INTRAVENOUS | Status: AC
  Administered 2020-12-21: 4 mg via INTRAMUSCULAR
  Filled 2020-12-21: qty 1

## 2020-12-21 MED ORDER — HYDROCODONE-ACETAMINOPHEN 5-325 MG PO TABS
1.0000 | ORAL_TABLET | Freq: Once | ORAL | Status: AC
  Administered 2020-12-21: 1 via ORAL
  Filled 2020-12-21: qty 1

## 2020-12-21 NOTE — ED Triage Notes (Signed)
Pt reports was getting up to walk and her leg fell asleep and she fell hurting her left shoulder. Pt not able to raise left arm.

## 2020-12-21 NOTE — Discharge Instructions (Addendum)
Do not use hydrocodone within 6 hours of use of temazepam or diazepam.

## 2020-12-21 NOTE — ED Provider Notes (Signed)
[facility] Emergency Department Provider Note   ____________________________________________   Event Date/Time   First MD Initiated Contact with Patient 12/21/20 1155     (approximate)  I have reviewed the triage vital signs and the nursing notes.   HISTORY  Chief Complaint Shoulder Injury and Fall    HPI Victoria Bonilla is a 65 y.o. female history of chronic pain hepatitis previous liver disease  Patient reports she got up this morning, she was in her recliner and her right leg foot started to fall asleep from positioning it.  She got up and when she stood this caused her to lose balance and she fell directly onto her left shoulder.  Did not hit her head or strike her neck.  No other injury.  She called her daughter who came in with her today  She does take codeine at home, but has not taken today.  She denies headache neck pain or frequent fall history.  She has been in her normal health.  She did have a fall couple weeks ago but reports that was an isolated incident secondary to tripping over her dog  She denies numbness tingling or weakness in left hand but any movement of the left hand or elbow causes pain right at her left shoulder joint  No chest pain no trouble breathing.  No abdominal pain.  Past Medical History:  Diagnosis Date   Asthma    Chronic pain    Gastric ulcer    Generalized anxiety disorder    Hepatitis C    Hypokalemia    Insomnia    Liver failure (HCC)    Muscle spasm    Renal disorder    Rheumatoid arthritis (HCC)    TB (pulmonary tuberculosis) 1984    Patient Active Problem List   Diagnosis Date Noted   Insomnia 06/30/2015   Night muscle spasms 06/30/2015   Hypokalemia 06/30/2015   Asthma, mild intermittent 06/30/2015   Generalized anxiety disorder 06/30/2015   Chronic pain 06/30/2015   Hepatitis C 06/30/2015    Past Surgical History:  Procedure Laterality Date   CHOLECYSTECTOMY      Prior to Admission  medications   Medication Sig Start Date End Date Taking? Authorizing Provider  HYDROcodone-acetaminophen (NORCO/VICODIN) 5-325 MG tablet Take 1 tablet by mouth every 6 (six) hours as needed for up to 3 days for severe pain. 12/21/20 12/24/20 Yes Sharyn Creamer, MD  albuterol (PROVENTIL HFA;VENTOLIN HFA) 108 (90 Base) MCG/ACT inhaler Inhale 2 puffs into the lungs every 6 (six) hours as needed. 06/30/15   Doreene Nest, NP  amoxicillin-clavulanate (AUGMENTIN) 875-125 MG tablet Take 1 tablet by mouth 2 (two) times daily. 07/19/15   Hagler, Jami L, PA-C  busPIRone (BUSPAR) 7.5 MG tablet Take 1 tablet (7.5 mg total) by mouth 2 (two) times daily. 06/30/15   Doreene Nest, NP  carisoprodol (SOMA) 350 MG tablet Take 1 tablet (350 mg total) by mouth 4 (four) times daily. 06/30/15   Doreene Nest, NP  diazepam (VALIUM) 10 MG tablet Take 10 mg by mouth every 8 (eight) hours as needed.     [provider]  lidocaine (LIDODERM) 5 % Place 1 patch onto the skin daily. Remove & Discard patch within 12 hours or as directed by MD 11/25/17   Enid Derry, PA-C  magic mouthwash w/lidocaine SOLN Take 5 mLs by mouth 4 (four) times daily as needed for mouth pain. 07/19/15   Hagler, Jami L, PA-C  potassium chloride SA (  K-DUR,KLOR-CON) 20 MEQ tablet Take 1 tablet (20 mEq total) by mouth daily. 06/30/15 06/29/16  Doreene Nest, NP  predniSONE (DELTASONE) 20 MG tablet Take 2 tablets by mouth, once daily, for 5 days 07/19/15   Hagler, Jami L, PA-C  sucralfate (CARAFATE) 1 G tablet Take 1 tablet (1 g total) by mouth 4 (four) times daily. 03/03/15   Sharman Cheek, MD  temazepam (RESTORIL) 30 MG capsule Take 30 mg by mouth at bedtime as needed for sleep.    [provider]  traZODone (DESYREL) 100 MG tablet Take 1 tablet (100 mg total) by mouth at bedtime. 06/30/15   Doreene Nest, NP   Medications reviewed, patient currently not taking hydrocodone but has used in the past for pain.  She does take  codeine, but we discussed and she be agreeable to discontinue this if she needs to take hydrocodone temporarily for shoulder pain.  She has a regular prescriber who does have her on temazepam for anxiety  Allergies Nsaids, Tolmetin, Tramadol, Iodine, Latex, Other, and Iodides  No family history on file.  Social History Social History   Tobacco Use   Smoking status: Former   Smokeless tobacco: Never  Substance Use Topics   Alcohol use: No   Drug use: No    Review of Systems Constitutional: No fever/chills or recent illness except when she tripped over her dog Eyes: No visual changes. ENT: No neck pain Cardiovascular: Denies chest pain.  Quite a bit of pain in her left shoulder Respiratory: Denies shortness of breath. Gastrointestinal: No abdominal pain.   Genitourinary: Negative for dysuria. Musculoskeletal: Negative for back pain.  Pain in her left shoulder joint.  Some slight soreness of her right foot, reports that it is very minimal if any at all.  Reports she does not think she really injured it Skin: Negative for rash. Neurological: Negative for headaches or areas weakness or numbness except briefly for just a couple minutes her right foot was numb but went away after getting up from the chair but this also caused her to fall..    ____________________________________________   PHYSICAL EXAM:  VITAL SIGNS: ED Triage Vitals  Enc Vitals Group     BP 12/21/20 1004 118/60     Pulse Rate 12/21/20 1004 65     Resp 12/21/20 1004 18     Temp 12/21/20 1004 97.9 F (36.6 C)     Temp Source 12/21/20 1004 Oral     SpO2 12/21/20 1004 100 %     Weight 12/21/20 1002 95 lb (43.1 kg)     Height 12/21/20 1002 5\' 7"  (1.702 m)     Head Circumference --      Peak Flow --      Pain Score 12/21/20 1001 10     Pain Loc --      Pain Edu? --      Excl. in GC? --     Constitutional: Alert and oriented.  She is sitting forward wincing in pain reporting pain in her left shoulder that  looks quite severe Eyes: Conjunctivae are normal. Head: Atraumatic. Nose: No congestion/rhinnorhea. Mouth/Throat: Mucous membranes are moist. Neck: No stridor.  Cardiovascular: Normal rate, regular rhythm. Grossly normal heart sounds.  Good peripheral circulation. Respiratory: Normal respiratory effort.  No retractions. Lungs CTAB. Gastrointestinal: Soft and nontender. No distention. Musculoskeletal: No midline cervical tenderness.  No thoracic or lumbar tenderness.  RIGHT Right upper extremity demonstrates normal strength, good use of all muscles. No edema bruising or  contusions of the right shoulder/upper arm, right elbow, right forearm / hand. Full range of motion of the right right upper extremity without pain. No evidence of trauma. Strong radial pulse. Intact median/ulnar/radial neuro-muscular exam.  LEFT Left upper extremity demonstrates limitation in use due to severe pain in the left shoulder. No edema bruising or contusions of the left shoulder/upper arm, left elbow, left forearm / hand however she has obvious point tenderness at the proximal left humerus. Strong radial pulse. Intact median/ulnar/radial neuro-muscular exam.  Shirt removed, and no lesions noted no open fracture.   No lower extremity tenderness nor edema. Negative [place] and foot rule R foot/ankle.   Neurologic:  Normal speech and language. No gross focal neurologic deficits are appreciated.  Skin:  Skin is warm, dry and intact. No rash noted. Psychiatric: Mood and affect are normal. Speech and behavior are normal.  ____________________________________________   LABS (all labs ordered are listed, but only abnormal results are displayed)  Labs Reviewed - No data to display ____________________________________________  EKG   ____________________________________________  RADIOLOGY  DG Elbow 2 Views Left  Result Date: 12/21/2020 CLINICAL DATA:  Fall, proximal humerus fracture, elbow pain EXAM: LEFT ELBOW -  2 VIEW COMPARISON:  None. FINDINGS: There is no evidence of acute fracture, dislocation, or joint effusion. Partially imaged plate and screw fixation of mid the ulnar diaphysis. There is no evidence of arthropathy or other focal bone abnormality. Soft tissues are unremarkable. IMPRESSION: 1. No acute fracture or dislocation of the left elbow. No elbow joint effusion to suggest radiographically occult fracture left radial head or neck. 2. Partially imaged plate and screw fixation of mid ulnar diaphysis. Electronically Signed   By: Lauralyn PrimesAlex  Bibbey M.D.   On: 12/21/2020 14:00   DG Shoulder Left  Result Date: 12/21/2020 CLINICAL DATA:  Acute LEFT shoulder pain following fall. EXAM: LEFT SHOULDER - 2+ VIEW COMPARISON:  None. FINDINGS: A LEFT humeral neck fracture is identified within least 5 mm displacement. There is no evidence of dislocation of the humeral head. No other acute abnormalities are noted. IMPRESSION: LEFT humeral neck fracture. Electronically Signed   By: Harmon PierJeffrey  Hu M.D.   On: 12/21/2020 10:40     ____________________________________________   PROCEDURES  Procedure(s) performed: None  Procedures  Critical Care performed: No  ____________________________________________   INITIAL IMPRESSION / ASSESSMENT AND PLAN / ED COURSE  Pertinent labs & imaging results that were available during my care of the patient were reviewed by me and considered in my medical decision making (see chart for details).   Fall, reports isolated injury with pain in the left shoulder.  Rings removed from the left hand with success, patient kept them herself.  No neurovascular compromise.  Pain isolated to the left shoulder.  No evidence of injury to the remainder of the left upper extremity or left upper chest.    Clinical Course as of 12/21/20 1439  Sun Dec 21, 2020  1210 tolerates both morphine and hydorocone without issue prior, verified with patient and in EPIC [MQ]  1342 Patient does appear more  comfortable after receiving morphine, but does continue to report moderate to severe pain in the left shoulder.  We will give hydrocodone at this time.  Patient's chart was able to be removed, no evidence of bruising bleeding or deformity except notable focal discomfort at the left proximal humerus.  She also reports slight discomfort over the elbow, but low pretest probability for fracture however given her pain will also obtain imaging to evaluate  for fracture.  No tenderness in the forearm or hand.  Good use of the left hand. [MQ]  1410 Message sent to Dr. Joice Lofts requesting ortho setup appropriate outpatient follow-up [MQ]    Clinical Course User Index [MQ] Sharyn Creamer, MD   Lengthy discussion with the patient regarding safe use of hydrocodone.  She does take chronically benzodiazepine, but also has what appears to be a fairly severe painful condition.  We will discontinue use of her codeine.  Discussed to never use more hydrocodone than prescribed, will prescribe very short course, recommend follow-up with orthopedics and also to discuss with her primary provider if longer use of pain medication is required  ----------------------------------------- 2:39 PM on 12/21/2020 ----------------------------------------- Pain is improved with sling and swath.  Good perfusion left hand.  Patient and daughter comfortable with plan for discharge, discussed carefully precautions around use of hydrocodone.  Patient agreeable not to utilize within hours of taking diazepam and temazepam which she takes nightly for sleep.  Also discussed careful return precautions around her injury and follow-up recommendations.  Patient is not driving.  Return precautions and treatment recommendations and follow-up discussed with the patient who is agreeable with the plan.  Patient able to ambulate.  Reports the right foot is slightly sore.  Offered to obtain an x-ray to evaluate and assure no fracture, though low pretest and  clinical suspicion for fracture.  Patient reports it feels sore more bruised and does not wish to have an x-ray to rule out fracture which I think is also reasonable.  We will be following up with orthopedics.  She is able to ambulate without difficulty but reports right foot just slightly sore  Orthopedics MD, Dr. Joice Lofts is advised me that he will have clinic schedule close and appropriate follow-up ____________________________________________   FINAL CLINICAL IMPRESSION(S) / ED DIAGNOSES  Final diagnoses:  Other closed nondisplaced fracture of proximal end of left humerus, initial encounter        Note:  This document was prepared using Dragon voice recognition software and may include unintentional dictation errors       Sharyn Creamer, MD 12/21/20 1441

## 2020-12-21 NOTE — ED Notes (Addendum)
See triage note  Presents with left upper arm/shoulder pain  States she fell this am  Landed on shoulder  Unable to move arm d/t pain

## 2020-12-21 NOTE — ED Notes (Signed)
Patient verbalizes understanding of discharge instructions. Opportunity for questioning and answers were provided. Armband removed by staff, pt discharged from ED. Ambulated out to lobby with daughter  

## 2021-07-15 DEATH — deceased

## 2022-05-28 IMAGING — CR DG SHOULDER 2+V*L*
3 series · 3 of 3 positions shown · non-contrast
Comparison: None.

CLINICAL DATA: Acute LEFT shoulder pain following fall.

EXAM:
LEFT SHOULDER - 2+ VIEW

[shoulder grashey]
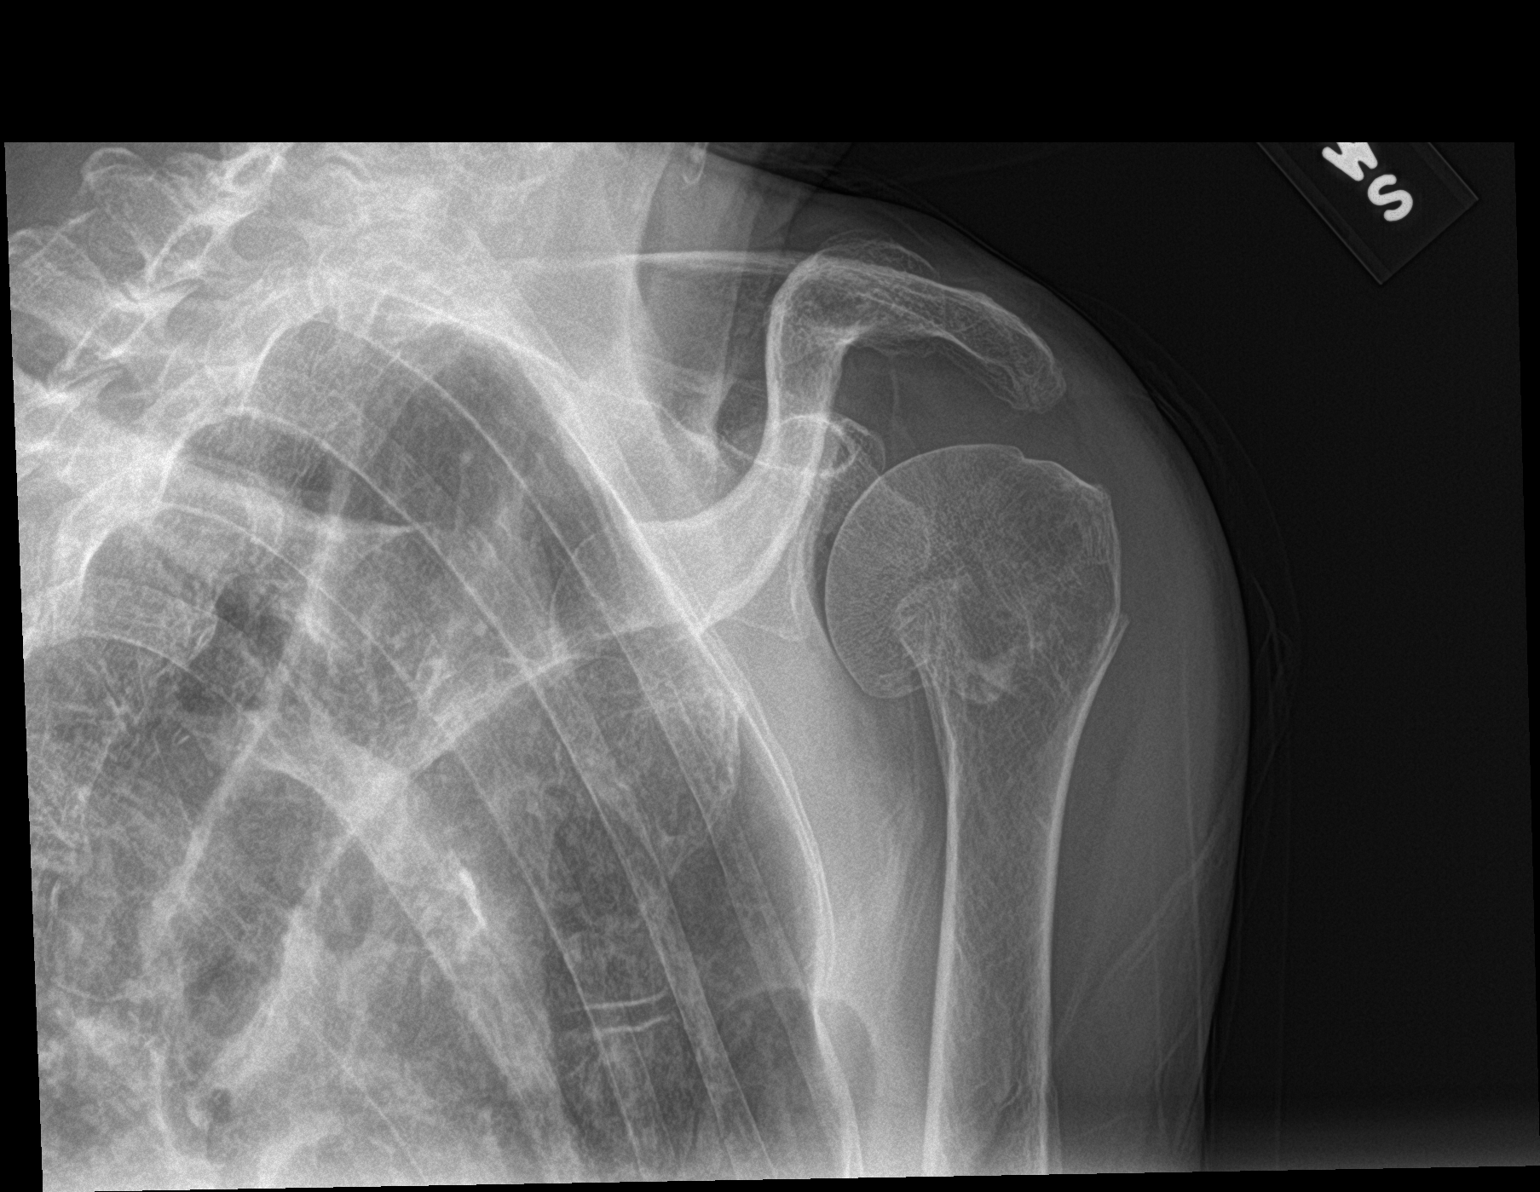

[shoulder y view]
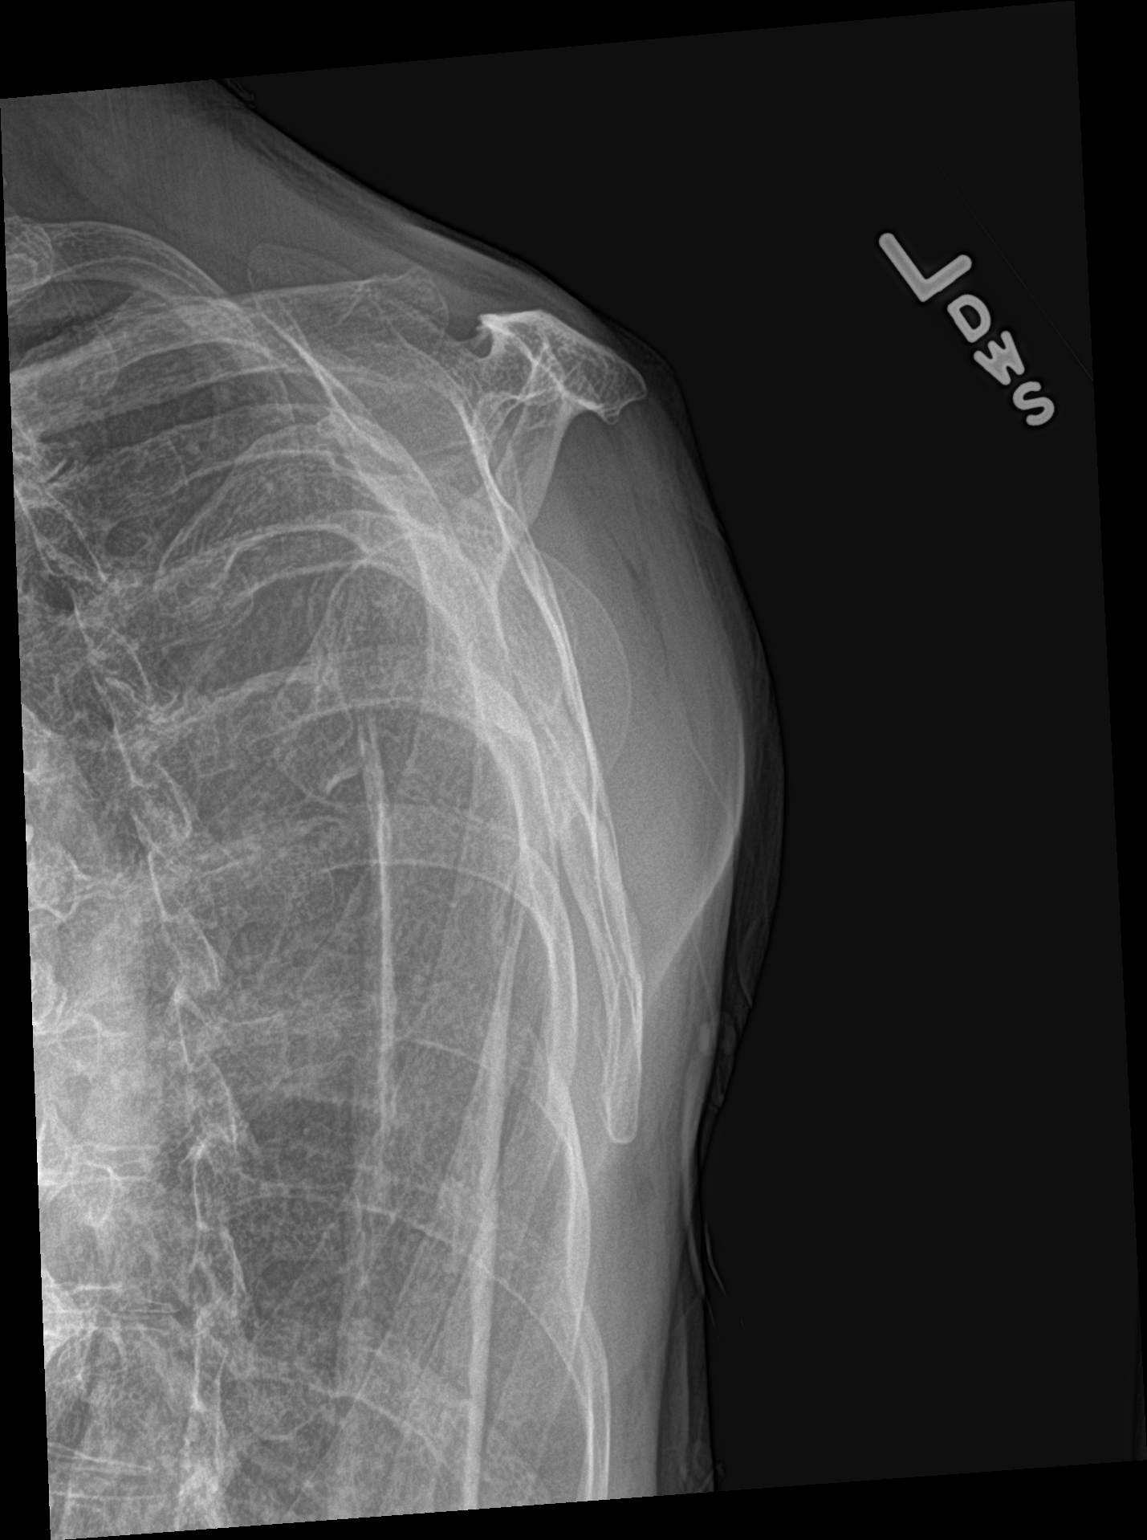

[shoulder ap neutral]
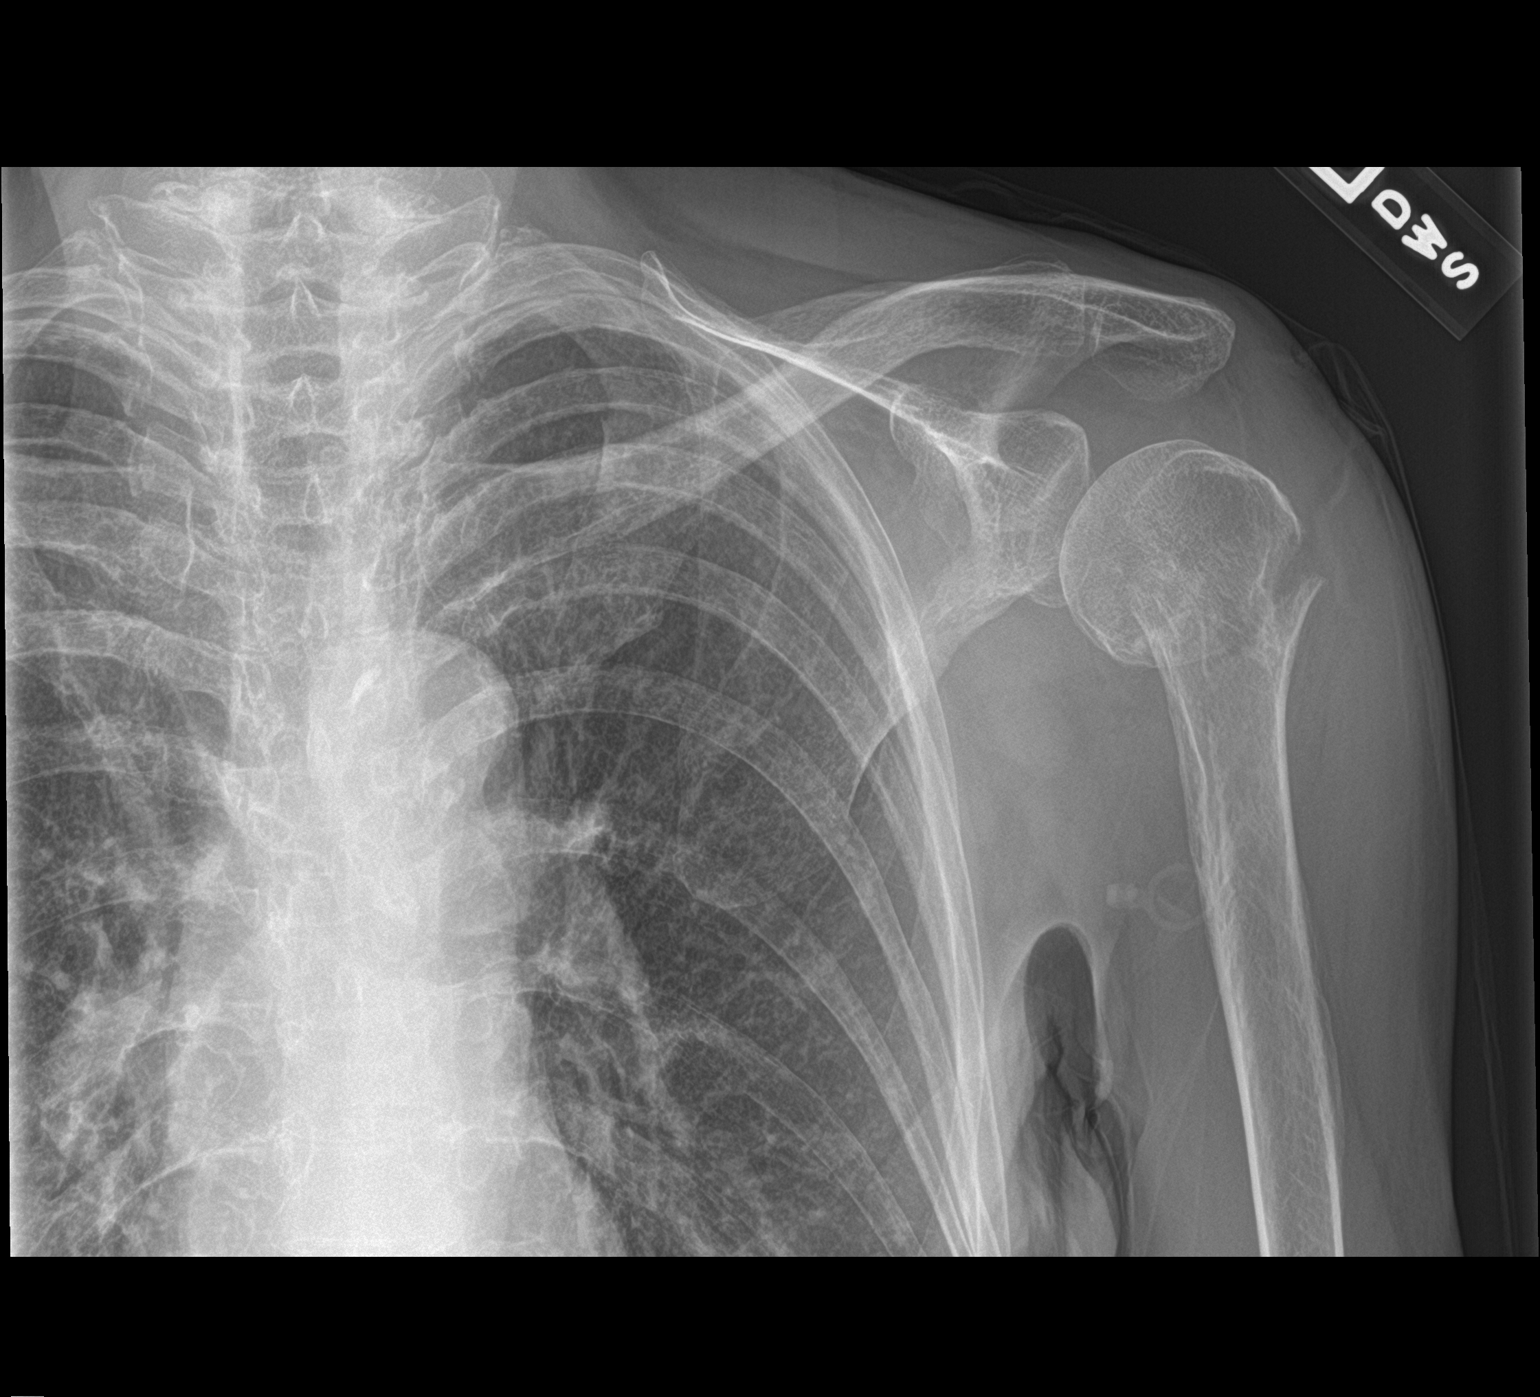

[3 of 3 positions shown; findings below may reference images not displayed]

FINDINGS: A LEFT humeral neck fracture is identified within least 5 mm
displacement.

There is no evidence of dislocation of the humeral head.

No other acute abnormalities are noted.
IMPRESSION: LEFT humeral neck fracture.

## 2022-05-28 IMAGING — DX DG ELBOW 2V*L*
2 series · 2 of 2 positions shown · non-contrast
Comparison: None.

CLINICAL DATA: Fall, proximal humerus fracture, elbow pain

EXAM:
LEFT ELBOW - 2 VIEW

[elbow ap]
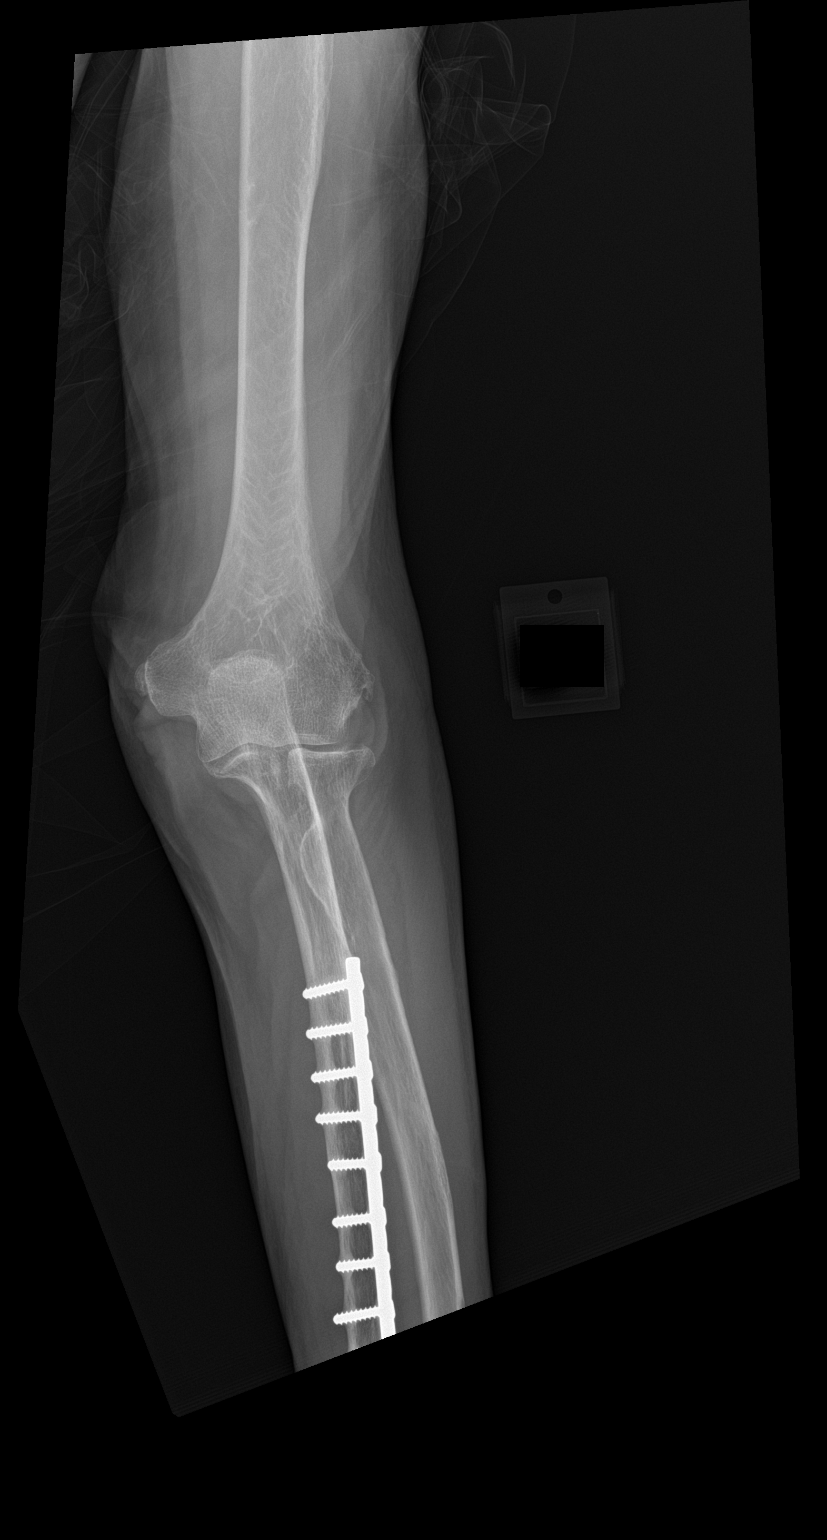

[elbow lat]
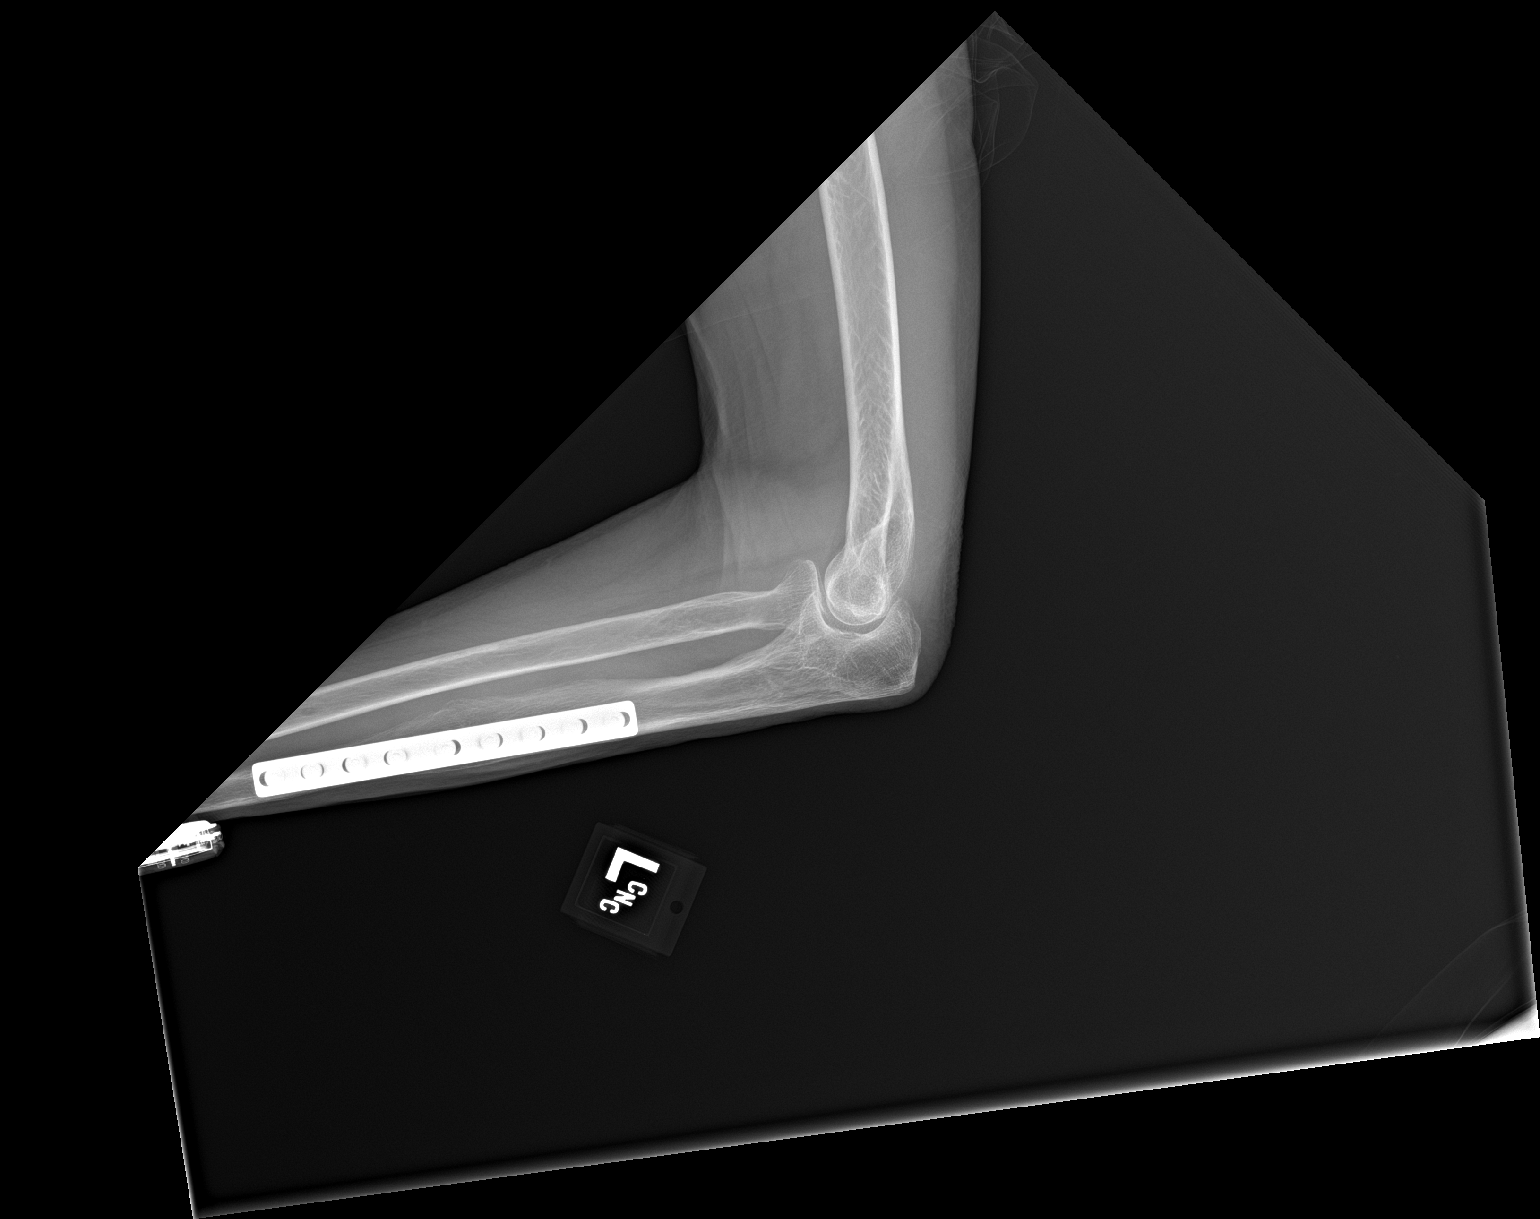

[2 of 2 positions shown; findings below may reference images not displayed]

FINDINGS: There is no evidence of acute fracture, dislocation, or joint
effusion. Partially imaged plate and screw fixation of mid the ulnar
diaphysis. There is no evidence of arthropathy or other focal bone
abnormality. Soft tissues are unremarkable.
IMPRESSION: 1. No acute fracture or dislocation of the left elbow. No elbow
joint effusion to suggest radiographically occult fracture left
radial head or neck.
2. Partially imaged plate and screw fixation of mid ulnar diaphysis.
